# Patient Record
Sex: Male | Born: 1960
Health system: Southern US, Community
[De-identification: ages and names within clinical notes are randomized; demographics above are authoritative.]

## PROBLEM LIST (undated history)

## (undated) DIAGNOSIS — I10 Essential (primary) hypertension: Secondary | ICD-10-CM

## (undated) DIAGNOSIS — E119 Type 2 diabetes mellitus without complications: Secondary | ICD-10-CM

## (undated) DIAGNOSIS — E1169 Type 2 diabetes mellitus with other specified complication: Secondary | ICD-10-CM

## (undated) DIAGNOSIS — E079 Disorder of thyroid, unspecified: Secondary | ICD-10-CM

## (undated) DIAGNOSIS — Z87898 Personal history of other specified conditions: Secondary | ICD-10-CM

## (undated) HISTORY — DX: Essential (primary) hypertension: I10

## (undated) HISTORY — DX: Hyperlipidemia, unspecified: E11.69

## (undated) HISTORY — PX: COLONOSCOPY: SHX174

## (undated) HISTORY — PX: UPPER GI ENDOSCOPY: SHX6162

## (undated) HISTORY — DX: Type 2 diabetes mellitus without complications: E11.9

---

## 1999-09-03 ENCOUNTER — Encounter: Payer: Self-pay | Admitting: Family Medicine

## 1999-09-03 ENCOUNTER — Ambulatory Visit (HOSPITAL_COMMUNITY): Admission: RE | Admit: 1999-09-03 | Discharge: 1999-09-03 | Payer: Self-pay | Admitting: Family Medicine

## 1999-09-17 ENCOUNTER — Ambulatory Visit (HOSPITAL_COMMUNITY): Admission: RE | Admit: 1999-09-17 | Discharge: 1999-09-17 | Payer: Self-pay | Admitting: Family Medicine

## 1999-09-17 ENCOUNTER — Encounter: Payer: Self-pay | Admitting: Family Medicine

## 2000-06-03 ENCOUNTER — Other Ambulatory Visit: Admission: RE | Admit: 2000-06-03 | Discharge: 2000-06-03 | Payer: Self-pay | Admitting: Family Medicine

## 2000-10-03 ENCOUNTER — Ambulatory Visit (HOSPITAL_COMMUNITY): Admission: RE | Admit: 2000-10-03 | Discharge: 2000-10-03 | Payer: Self-pay | Admitting: Internal Medicine

## 2000-10-03 ENCOUNTER — Encounter: Payer: Self-pay | Admitting: Internal Medicine

## 2002-07-10 ENCOUNTER — Encounter: Payer: Self-pay | Admitting: Surgery

## 2002-07-19 ENCOUNTER — Observation Stay (HOSPITAL_COMMUNITY): Admission: RE | Admit: 2002-07-19 | Discharge: 2002-07-20 | Payer: Self-pay | Admitting: Surgery

## 2003-08-28 ENCOUNTER — Ambulatory Visit (HOSPITAL_COMMUNITY): Admission: RE | Admit: 2003-08-28 | Discharge: 2003-08-28 | Payer: Self-pay | Admitting: Family Medicine

## 2003-09-14 HISTORY — PX: THYROID SURGERY: SHX805

## 2012-10-27 ENCOUNTER — Ambulatory Visit (INDEPENDENT_AMBULATORY_CARE_PROVIDER_SITE_OTHER): Payer: Medicare HMO | Admitting: Emergency Medicine

## 2012-10-27 VITALS — BP 130/88 | HR 108 | Temp 98.0°F | Resp 16 | Ht 68.5 in | Wt 179.4 lb

## 2012-10-27 DIAGNOSIS — H8110 Benign paroxysmal vertigo, unspecified ear: Secondary | ICD-10-CM

## 2012-10-27 MED ORDER — MECLIZINE HCL 25 MG PO TABS: 25.0000 mg | ORAL_TABLET | Freq: Three times a day (TID) | ORAL | Status: DC | PRN

## 2012-10-27 NOTE — Progress Notes (Signed)
[facility] [address], [place] [address] [phone]  Date:  10/27/2012   Name:  Adrian Hampton   DOB:  Jun 26, 1961   MRN:  [id number]  PCP:  Provider Not In System    Chief Complaint: Dizziness   History of Present Illness:  Adrian Hampton is a 52 y.o. very pleasant male patient who presents with the following:  Ill for past two days with intermittent dizziness. Says started when he got out of bed Wednesday.  Occurs when he turns his head or looks up or down.  No antecedent illness or injury.  No neuro or visual symptoms.  No previous dizziness.  No nausea or vomiting.  There is no problem list on file for this patient.   Past Medical History  Diagnosis Date  . Hypertension     Past Surgical History  Procedure Laterality Date  . Thyroid surgery  2005    thyroidectomy    History  Substance Use Topics  . Smoking status: Never Smoker   . Smokeless tobacco: Not on file  . Alcohol Use: Not on file    No family history on file.  No Known Allergies  Medication list has been reviewed and updated.  No current outpatient prescriptions on file prior to visit.   No current facility-administered medications on file prior to visit.    Review of Systems:  As per HPI, otherwise negative.    Physical Examination: Filed Vitals:   10/27/12 1309  BP: 130/88  Pulse: 108  Temp: 98 F (36.7 C)  Resp: 16   Filed Vitals:   10/27/12 1309  Height: 5' 8.5" (1.74 m)  Weight: 179 lb 6.4 oz (81.375 kg)   Body mass index is 26.88 kg/(m^2). Ideal Body Weight: Weight in (lb) to have BMI = 25: 166.5  GEN: WDWN, NAD, Non-toxic, A & O x 3 HEENT: Atraumatic, Normocephalic. Neck supple. No masses, No LAD. Ears and Nose: No external deformity. CV: RRR, No M/G/R. No JVD. No thrill. No extra heart sounds. PULM: CTA B, no wheezes, crackles, rhonchi. No retractions. No resp. distress. No accessory muscle use. ABD: S, NT, ND, +BS. No rebound. No HSM. EXTR:  No c/c/e NEURO Normal gait.  PSYCH: Normally interactive. Conversant. Not depressed or anxious appearing.  Calm demeanor.    Assessment and Plan: Benign positional vertigo antivert Follow up as needed  Carmelina Dane, MD

## 2013-04-25 ENCOUNTER — Encounter: Payer: Self-pay | Admitting: Emergency Medicine

## 2013-07-14 ENCOUNTER — Emergency Department (HOSPITAL_COMMUNITY)
Admission: EM | Admit: 2013-07-14 | Discharge: 2013-07-14 | Disposition: A | Discharge status: Home / Self Care | Payer: Managed Care, Other (non HMO) | Attending: Emergency Medicine | Admitting: Emergency Medicine

## 2013-07-14 ENCOUNTER — Encounter (HOSPITAL_COMMUNITY): Payer: Self-pay | Admitting: Emergency Medicine

## 2013-07-14 DIAGNOSIS — R110 Nausea: Secondary | ICD-10-CM

## 2013-07-14 DIAGNOSIS — I10 Essential (primary) hypertension: Secondary | ICD-10-CM | POA: Insufficient documentation

## 2013-07-14 DIAGNOSIS — E079 Disorder of thyroid, unspecified: Secondary | ICD-10-CM | POA: Insufficient documentation

## 2013-07-14 DIAGNOSIS — R197 Diarrhea, unspecified: Secondary | ICD-10-CM | POA: Insufficient documentation

## 2013-07-14 DIAGNOSIS — E669 Obesity, unspecified: Secondary | ICD-10-CM | POA: Insufficient documentation

## 2013-07-14 DIAGNOSIS — R634 Abnormal weight loss: Secondary | ICD-10-CM

## 2013-07-14 DIAGNOSIS — R5381 Other malaise: Secondary | ICD-10-CM | POA: Insufficient documentation

## 2013-07-14 DIAGNOSIS — Z79899 Other long term (current) drug therapy: Secondary | ICD-10-CM | POA: Insufficient documentation

## 2013-07-14 DIAGNOSIS — R112 Nausea with vomiting, unspecified: Secondary | ICD-10-CM | POA: Insufficient documentation

## 2013-07-14 HISTORY — DX: Disorder of thyroid, unspecified: E07.9

## 2013-07-14 LAB — CBC
HCT: 43.5 % (ref 39.0–52.0)
Hemoglobin: 15.6 g/dL (ref 13.0–17.0)
MCH: 31.2 pg (ref 26.0–34.0)
MCHC: 35.9 g/dL (ref 30.0–36.0)
MCV: 87 fL (ref 78.0–100.0)
Platelets: 330 10*3/uL (ref 150–400)
RBC: 5 MIL/uL (ref 4.22–5.81)
RDW: 12.2 % (ref 11.5–15.5)
WBC: 5.9 10*3/uL (ref 4.0–10.5)

## 2013-07-14 LAB — COMPREHENSIVE METABOLIC PANEL
ALT: 28 U/L (ref 0–53)
AST: 22 U/L (ref 0–37)
Albumin: 4.1 g/dL (ref 3.5–5.2)
Alkaline Phosphatase: 70 U/L (ref 39–117)
BUN: 12 mg/dL (ref 6–23)
CO2: 27 mEq/L (ref 19–32)
Calcium: 9.8 mg/dL (ref 8.4–10.5)
Chloride: 101 mEq/L (ref 96–112)
Creatinine, Ser: 1.23 mg/dL (ref 0.50–1.35)
GFR calc Af Amer: 78 mL/min — ABNORMAL LOW (ref >90)
GFR calc non Af Amer: 67 mL/min — ABNORMAL LOW (ref >90)
Glucose, Bld: 101 mg/dL — ABNORMAL HIGH (ref 70–99)
Potassium: 4.1 mEq/L (ref 3.5–5.1)
Sodium: 136 mEq/L (ref 135–145)
Total Bilirubin: 0.6 mg/dL (ref 0.3–1.2)
Total Protein: 7.9 g/dL (ref 6.0–8.3)

## 2013-07-14 MED ORDER — ONDANSETRON HCL 4 MG PO TABS: 4.0000 mg | ORAL_TABLET | Freq: Four times a day (QID) | ORAL | Status: DC

## 2013-07-14 MED ORDER — ONDANSETRON HCL 4 MG/2ML IJ SOLN
4.0000 mg | Freq: Once | INTRAMUSCULAR | Status: AC
  Administered 2013-07-14: 4 mg via INTRAVENOUS
  Filled 2013-07-14: qty 2

## 2013-07-14 MED ORDER — SODIUM CHLORIDE 0.9 % IV BOLUS (SEPSIS)
1000.0000 mL | INTRAVENOUS | Status: AC
  Administered 2013-07-14: 1000 mL via INTRAVENOUS

## 2013-07-14 NOTE — ED Notes (Signed)
Pt had been seen by a GI MD earlier this week and placed on Nexium w/o relief.  Was treated around March of this year of this year for vertigo but states this differs b/c he only gets nauseated but doesn't get dizzy like he did w/vertigo.

## 2013-07-14 NOTE — ED Notes (Signed)
Erin, PA at bedside. 

## 2013-07-14 NOTE — ED Notes (Signed)
Pt from home c/o nausea and emesis that started on October 18th, ending on the 24th. Pt reports that he still has nausea, loss of appetite, weight loss. Pt states that he can eat and keep food down, but he has no appetite. Pt c/o dizziness when ambulating only. Pt denies blood in stool or urine. Pt was seen at PCP. Pt adds that he has lost 14lbs in 2 weeks. Pt is A&O and in NAD

## 2013-07-14 NOTE — ED Provider Notes (Signed)
CSN: [id number]     Arrival date & time 07/14/13  1236 History   First MD Initiated Contact with Patient 07/14/13 1318     Chief Complaint  Patient presents with  . Emesis  . Weight Loss   (Consider location/radiation/quality/duration/timing/severity/associated sxs/prior Treatment) HPI Pt is a 52yo male with hx of thyroid disease and HTN c/o 2 week hx of nausea and vomiting that started on Oct 18th ended Oct 24th but still reports nausea.  States he has lost 14lbs in 2 weeks.  While he is able to eat and keep food down, he reports having no appetite.  He was evaluated twice by his PCP for diarrhea, was given Ciprofloxacin at the beginning of the month.  That resolved, then the nausea and vomiting started.  Although vomiting has stopped, he still has nausea.  His PCP performed a BMP which was normal.  Pt was also seen by a GI specialist who prescribed Nexium, advised if the medication did not work, a scope would be performed next week. Denies hx of fever, chest pain, SOB, abdominal pain or dysuria. Denies blood in stool or urine. Denies recent travel or sick contacts.   Past Medical History  Diagnosis Date  . Hypertension   . Thyroid disease    Past Surgical History  Procedure Laterality Date  . Thyroid surgery  2005    thyroidectomy   No family history on file. History  Substance Use Topics  . Smoking status: Never Smoker   . Smokeless tobacco: Not on file  . Alcohol Use: Yes     Comment: socailly    Review of Systems  Constitutional: Positive for appetite change, fatigue and unexpected weight change ( 14lbs in 2 weeks). Negative for fever and chills.  Respiratory: Negative for shortness of breath.   Cardiovascular: Negative for chest pain.  Gastrointestinal: Positive for nausea and diarrhea. Negative for vomiting and abdominal pain.  Genitourinary: Negative for dysuria.  Neurological: Positive for weakness.  All other systems reviewed and are negative.    Allergies  Review  of patient's allergies indicates no known allergies.  Home Medications   Current Outpatient Rx  Name  Route  Sig  Dispense  Refill  . cetirizine (ZYRTEC) 10 MG tablet   Oral   Take 10 mg by mouth daily.         Marland Kitchen levothyroxine (SYNTHROID, LEVOTHROID) 112 MCG tablet   Oral   Take 112 mcg by mouth daily.         Marland Kitchen lisinopril (PRINIVIL,ZESTRIL) 40 MG tablet   Oral   Take 40 mg by mouth daily.         . promethazine (PHENERGAN) 25 MG tablet   Oral   Take 25 mg by mouth every 6 (six) hours as needed for nausea.         . ondansetron (ZOFRAN) 4 MG tablet   Oral   Take 1 tablet (4 mg total) by mouth every 6 (six) hours.   12 tablet   0    BP 134/85  Pulse 85  Temp(Src) 98.1 F (36.7 C) (Oral)  Resp 18  SpO2 98% Physical Exam  Nursing note and vitals reviewed. Constitutional: He appears well-developed and well-nourished.  Obese male lying comfortably in exam bed. NAD.  HENT:  Head: Normocephalic and atraumatic.  Right Ear: Hearing, tympanic membrane, external ear and ear canal normal.  Left Ear: Hearing, tympanic membrane, external ear and ear canal normal.  Nose: Nose normal.  Mouth/Throat: Uvula is midline,  oropharynx is clear and moist and mucous membranes are normal. No oropharyngeal exudate, posterior oropharyngeal edema, posterior oropharyngeal erythema or tonsillar abscesses.  Eyes: Conjunctivae are normal. No scleral icterus.  Neck: Normal range of motion.  Cardiovascular: Normal rate, regular rhythm and normal heart sounds.   Pulmonary/Chest: Effort normal and breath sounds normal. No respiratory distress. He has no wheezes. He has no rales. He exhibits no tenderness.  Abdominal: Soft. Bowel sounds are normal. He exhibits no distension and no mass. There is no tenderness. There is no rebound and no guarding.  Soft, non-distended, non-tender.  Musculoskeletal: Normal range of motion.  Neurological: He is alert.  Skin: Skin is warm and dry.    ED Course   Procedures (including critical care time) Labs Review Labs Reviewed  COMPREHENSIVE METABOLIC PANEL - Abnormal; Notable for the following:    Glucose, Bld 101 (*)    GFR calc non Af Amer 67 (*)    GFR calc Af Amer 78 (*)    All other components within normal limits  CBC   Imaging Review No results found.  EKG Interpretation   None       MDM   1. Nausea   2. Weight loss    Pt presenting with nausea and weight loss, although does not appear chronically ill.  Vitals: unremarkable.  Although pt had normal BMP performed at PCP just last week, will get a CMP to check liver enzymes and CBC.    Labs: GFR-potential CKD (discussed with pt) but no acute findings.  Labs otherwise, unremarkable.  All labs/imaging/findings discussed with patient. All questions answered and concerns addressed. Will discharge pt home and have pt f/u with GI and his PCP for further evaluation of nausea and weight loss.  Rx: zofran as pt currently has phenergan at home but states that has not helped.  Return precautions given. Pt verbalized understanding and agreement with tx plan. Vitals: unremarkable. Discharged in stable condition.    Discussed pt with attending during ED encounter and agrees with plan.     Junius Finner, PA-C 07/14/13 1556

## 2013-07-15 NOTE — ED Provider Notes (Signed)
Medical screening examination/treatment/procedure(s) were conducted as a shared visit with non-physician practitioner(s) and myself.  I personally evaluated the patient during the encounter.  EKG Interpretation   None       Pt notes recent wt loss. No abd pain. No cp or sob. No cough. No fever or chills. abd soft nt. Labs.    Suzi Roots, MD 07/15/13 [phone]

## 2013-07-17 ENCOUNTER — Other Ambulatory Visit: Payer: Self-pay | Admitting: Gastroenterology

## 2013-07-17 DIAGNOSIS — R634 Abnormal weight loss: Secondary | ICD-10-CM

## 2013-07-17 DIAGNOSIS — R198 Other specified symptoms and signs involving the digestive system and abdomen: Secondary | ICD-10-CM

## 2013-07-17 DIAGNOSIS — R110 Nausea: Secondary | ICD-10-CM

## 2013-07-19 ENCOUNTER — Ambulatory Visit
Admission: RE | Admit: 2013-07-19 | Discharge: 2013-07-19 | Disposition: A | Discharge status: Home / Self Care | Payer: Managed Care, Other (non HMO) | Source: Ambulatory Visit | Attending: Gastroenterology | Admitting: Gastroenterology

## 2013-07-19 DIAGNOSIS — R634 Abnormal weight loss: Secondary | ICD-10-CM

## 2013-07-19 DIAGNOSIS — R110 Nausea: Secondary | ICD-10-CM

## 2013-07-19 DIAGNOSIS — R198 Other specified symptoms and signs involving the digestive system and abdomen: Secondary | ICD-10-CM

## 2013-08-17 ENCOUNTER — Other Ambulatory Visit: Payer: Self-pay | Admitting: Gastroenterology

## 2013-08-17 DIAGNOSIS — R112 Nausea with vomiting, unspecified: Secondary | ICD-10-CM

## 2013-08-17 DIAGNOSIS — R634 Abnormal weight loss: Secondary | ICD-10-CM

## 2013-08-20 ENCOUNTER — Ambulatory Visit
Admission: RE | Admit: 2013-08-20 | Discharge: 2013-08-20 | Disposition: A | Discharge status: Home / Self Care | Payer: Managed Care, Other (non HMO) | Source: Ambulatory Visit | Attending: Gastroenterology | Admitting: Gastroenterology

## 2013-08-20 DIAGNOSIS — R112 Nausea with vomiting, unspecified: Secondary | ICD-10-CM

## 2013-08-20 DIAGNOSIS — R634 Abnormal weight loss: Secondary | ICD-10-CM

## 2013-08-20 MED ORDER — IOHEXOL 300 MG/ML  SOLN
100.0000 mL | Freq: Once | INTRAMUSCULAR | Status: AC | PRN
  Administered 2013-08-20: 100 mL via INTRAVENOUS

## 2013-10-10 ENCOUNTER — Other Ambulatory Visit: Payer: Self-pay | Admitting: Gastroenterology

## 2013-10-10 DIAGNOSIS — R112 Nausea with vomiting, unspecified: Secondary | ICD-10-CM

## 2013-10-12 ENCOUNTER — Ambulatory Visit (HOSPITAL_COMMUNITY)
Admission: RE | Admit: 2013-10-12 | Discharge: 2013-10-12 | Disposition: A | Discharge status: Home / Self Care | Payer: Managed Care, Other (non HMO) | Source: Ambulatory Visit | Attending: Gastroenterology | Admitting: Gastroenterology

## 2013-10-12 DIAGNOSIS — R948 Abnormal results of function studies of other organs and systems: Secondary | ICD-10-CM | POA: Insufficient documentation

## 2013-10-12 DIAGNOSIS — R112 Nausea with vomiting, unspecified: Secondary | ICD-10-CM | POA: Insufficient documentation

## 2013-10-12 MED ORDER — TECHNETIUM TC 99M MEBROFENIN IV KIT
5.0000 | PACK | Freq: Once | INTRAVENOUS | Status: AC | PRN
  Administered 2013-10-12: 5 via INTRAVENOUS

## 2013-10-12 MED ORDER — STERILE WATER FOR INJECTION IJ SOLN
INTRAMUSCULAR | Status: AC
  Filled 2013-10-12: qty 10

## 2013-10-12 MED ORDER — SINCALIDE 5 MCG IJ SOLR
0.0200 ug/kg | Freq: Once | INTRAMUSCULAR | Status: AC
  Administered 2013-10-12: 1.59 ug via INTRAVENOUS

## 2013-10-12 MED ORDER — SINCALIDE 5 MCG IJ SOLR
INTRAMUSCULAR | Status: AC
  Administered 2013-10-12: 12:00:00 1.59 ug via INTRAVENOUS
  Filled 2013-10-12: qty 5

## 2013-10-16 ENCOUNTER — Ambulatory Visit (INDEPENDENT_AMBULATORY_CARE_PROVIDER_SITE_OTHER): Payer: Managed Care, Other (non HMO) | Admitting: General Surgery

## 2013-10-16 ENCOUNTER — Encounter (INDEPENDENT_AMBULATORY_CARE_PROVIDER_SITE_OTHER): Payer: Self-pay | Admitting: General Surgery

## 2013-10-16 VITALS — BP 114/70 | HR 80 | Temp 98.1°F | Resp 14 | Ht 68.5 in | Wt 174.2 lb

## 2013-10-16 DIAGNOSIS — K828 Other specified diseases of gallbladder: Secondary | ICD-10-CM

## 2013-10-16 NOTE — Progress Notes (Signed)
Patient ID: Adrian Hampton, male   DOB: 02/26/1961, 52 y.o.   MRN: [id number]  Chief Complaint  Patient presents with  . New Evaluation    eval biliary dyskenisia    HPI Adrian Hampton is a 52 y.o. male.  Being evaluated for biliary dyskinesia.  HPI This patient has been having symptoms of severe nausea and vomiting dating back to October 2014. During December of 2014 his symptoms actually abated however they came back within the last 2 weeks, starting when he ate a hamburger at a local restaurant. Since then he awakens with nausea and if he tries he did start vomiting profusely. He has multiple antinausea medications. A HIDA scan was done which demonstrated a biliary contraction of only 3.8%  . He was refer to surgery for evaluation. Past Medical History  Diagnosis Date  . Hypertension   . Thyroid disease     Past Surgical History  Procedure Laterality Date  . Thyroid surgery  2005    thyroidectomy    Family History  Problem Relation Age of Onset  . Cancer Mother     pancreatic    Social History History  Substance Use Topics  . Smoking status: Never Smoker   . Smokeless tobacco: Never Used  . Alcohol Use: Yes     Comment: socailly    No Known Allergies  Current Outpatient Prescriptions  Medication Sig Dispense Refill  . levothyroxine (SYNTHROID, LEVOTHROID) 112 MCG tablet Take 112 mcg by mouth daily.      . lisinopril (PRINIVIL,ZESTRIL) 40 MG tablet Take 40 mg by mouth daily.      . ondansetron (ZOFRAN) 4 MG tablet Take 1 tablet (4 mg total) by mouth every 6 (six) hours.  12 tablet  0  . promethazine (PHENERGAN) 25 MG tablet Take 25 mg by mouth every 6 (six) hours as needed for nausea.       No current facility-administered medications for this visit.    Review of Systems Review of Systems  Gastrointestinal: Positive for nausea and vomiting.  All other systems reviewed and are negative.    Blood pressure 114/70, pulse 80, temperature 98.1 F (36.7 C),  temperature source Oral, resp. rate 14, height 5' 8.5" (1.74 m), weight 174 lb 3.2 oz (79.017 kg).  Physical Exam Physical Exam  Constitutional: He is oriented to person, place, and time. He appears well-developed and well-nourished.  HENT:  Head: Normocephalic and atraumatic.  Eyes: Conjunctivae and EOM are normal. Pupils are equal, round, and reactive to light.  Neck: Normal range of motion. Neck supple.  Cardiovascular: Normal rate, regular rhythm and normal heart sounds.   Pulmonary/Chest: Effort normal and breath sounds normal.  Abdominal: Soft. Bowel sounds are normal. He exhibits no distension. There is no tenderness.  Musculoskeletal: Normal range of motion.  Neurological: He is alert and oriented to person, place, and time. He has normal reflexes.  Skin: Skin is warm and dry.  Psychiatric: He has a normal mood and affect. His behavior is normal. Judgment and thought content normal.    Data Reviewed I have reviewed the patient's laboratory studies and his radiologic studies  Assessment    Biliary dyskinesia with significant symptoms of nausea vomiting. I believe that this is very likely causing the patient's problem.     Plan    Laparoscopic cholecystectomy with possible cholangiogram. I told the patient that based on his current symptoms and the HIDA scan That he should have significant improvement after surgery. The patient wants to   go ahead and schedule. The risk and benefits have been explained to the patient and he wishes to proceed.       Donelle Hise O 10/16/2013, 12:38 PM    

## 2013-10-26 ENCOUNTER — Encounter (HOSPITAL_BASED_OUTPATIENT_CLINIC_OR_DEPARTMENT_OTHER): Payer: Self-pay | Admitting: *Deleted

## 2013-10-26 NOTE — Progress Notes (Signed)
To come in for ekg and CCS labs-has been 8 yr since last ekg-never any problems

## 2013-10-29 ENCOUNTER — Other Ambulatory Visit: Payer: Self-pay

## 2013-10-29 ENCOUNTER — Encounter (HOSPITAL_BASED_OUTPATIENT_CLINIC_OR_DEPARTMENT_OTHER)
Admission: RE | Admit: 2013-10-29 | Discharge: 2013-10-29 | Disposition: A | Discharge status: Home / Self Care | Payer: Managed Care, Other (non HMO) | Source: Ambulatory Visit | Attending: General Surgery | Admitting: General Surgery

## 2013-10-29 LAB — COMPREHENSIVE METABOLIC PANEL
ALBUMIN: 4 g/dL (ref 3.5–5.2)
ALK PHOS: 76 U/L (ref 39–117)
ALT: 37 U/L (ref 0–53)
AST: 26 U/L (ref 0–37)
BUN: 11 mg/dL (ref 6–23)
CHLORIDE: 104 meq/L (ref 96–112)
CO2: 25 mEq/L (ref 19–32)
Calcium: 9.6 mg/dL (ref 8.4–10.5)
Creatinine, Ser: 1.2 mg/dL (ref 0.50–1.35)
GFR calc Af Amer: 80 mL/min — ABNORMAL LOW (ref >90)
GFR calc non Af Amer: 69 mL/min — ABNORMAL LOW (ref >90)
Glucose, Bld: 96 mg/dL (ref 70–99)
POTASSIUM: 4.8 meq/L (ref 3.7–5.3)
SODIUM: 141 meq/L (ref 137–147)
TOTAL PROTEIN: 7.8 g/dL (ref 6.0–8.3)
Total Bilirubin: 0.6 mg/dL (ref 0.3–1.2)

## 2013-10-29 LAB — CBC WITH DIFFERENTIAL/PLATELET
BASOS ABS: 0 10*3/uL (ref 0.0–0.1)
BASOS PCT: 1 % (ref 0–1)
EOS ABS: 0.1 10*3/uL (ref 0.0–0.7)
Eosinophils Relative: 2 % (ref 0–5)
HCT: 42.7 % (ref 39.0–52.0)
Hemoglobin: 15.2 g/dL (ref 13.0–17.0)
Lymphocytes Relative: 34 % (ref 12–46)
Lymphs Abs: 1.7 10*3/uL (ref 0.7–4.0)
MCH: 31.1 pg (ref 26.0–34.0)
MCHC: 35.6 g/dL (ref 30.0–36.0)
MCV: 87.5 fL (ref 78.0–100.0)
MONOS PCT: 8 % (ref 3–12)
Monocytes Absolute: 0.4 10*3/uL (ref 0.1–1.0)
NEUTROS ABS: 2.8 10*3/uL (ref 1.7–7.7)
Neutrophils Relative %: 56 % (ref 43–77)
PLATELETS: 354 10*3/uL (ref 150–400)
RBC: 4.88 MIL/uL (ref 4.22–5.81)
RDW: 12.4 % (ref 11.5–15.5)
WBC: 4.9 10*3/uL (ref 4.0–10.5)

## 2013-10-30 ENCOUNTER — Ambulatory Visit (HOSPITAL_COMMUNITY): Payer: Managed Care, Other (non HMO)

## 2013-11-01 ENCOUNTER — Encounter (HOSPITAL_BASED_OUTPATIENT_CLINIC_OR_DEPARTMENT_OTHER): Payer: Managed Care, Other (non HMO) | Admitting: Anesthesiology

## 2013-11-01 ENCOUNTER — Encounter (HOSPITAL_BASED_OUTPATIENT_CLINIC_OR_DEPARTMENT_OTHER): Payer: Self-pay

## 2013-11-01 ENCOUNTER — Encounter (HOSPITAL_BASED_OUTPATIENT_CLINIC_OR_DEPARTMENT_OTHER)
Admission: RE | Disposition: A | Discharge status: Home / Self Care | Payer: Self-pay | Source: Ambulatory Visit | Attending: General Surgery

## 2013-11-01 ENCOUNTER — Ambulatory Visit (HOSPITAL_BASED_OUTPATIENT_CLINIC_OR_DEPARTMENT_OTHER)
Admission: RE | Admit: 2013-11-01 | Discharge: 2013-11-01 | Disposition: A | Discharge status: Home / Self Care | Payer: Managed Care, Other (non HMO) | Source: Ambulatory Visit | Attending: General Surgery | Admitting: General Surgery

## 2013-11-01 ENCOUNTER — Ambulatory Visit (HOSPITAL_BASED_OUTPATIENT_CLINIC_OR_DEPARTMENT_OTHER): Payer: Managed Care, Other (non HMO) | Admitting: Anesthesiology

## 2013-11-01 DIAGNOSIS — Z01810 Encounter for preprocedural cardiovascular examination: Secondary | ICD-10-CM | POA: Insufficient documentation

## 2013-11-01 DIAGNOSIS — I10 Essential (primary) hypertension: Secondary | ICD-10-CM | POA: Insufficient documentation

## 2013-11-01 DIAGNOSIS — Z01812 Encounter for preprocedural laboratory examination: Secondary | ICD-10-CM | POA: Insufficient documentation

## 2013-11-01 DIAGNOSIS — K811 Chronic cholecystitis: Secondary | ICD-10-CM | POA: Insufficient documentation

## 2013-11-01 DIAGNOSIS — K828 Other specified diseases of gallbladder: Secondary | ICD-10-CM | POA: Insufficient documentation

## 2013-11-01 DIAGNOSIS — E079 Disorder of thyroid, unspecified: Secondary | ICD-10-CM | POA: Insufficient documentation

## 2013-11-01 HISTORY — DX: Personal history of other specified conditions: Z87.898

## 2013-11-01 HISTORY — PX: CHOLECYSTECTOMY: SHX55

## 2013-11-01 LAB — POCT HEMOGLOBIN-HEMACUE: Hemoglobin: 15.4 g/dL (ref 13.0–17.0)

## 2013-11-01 SURGERY — LAPAROSCOPIC CHOLECYSTECTOMY WITH INTRAOPERATIVE CHOLANGIOGRAM
Anesthesia: General | Site: Abdomen

## 2013-11-01 MED ORDER — CHLORHEXIDINE GLUCONATE 4 % EX LIQD: 1.0000 "application " | Freq: Once | CUTANEOUS | Status: DC

## 2013-11-01 MED ORDER — PROPOFOL 10 MG/ML IV BOLUS
INTRAVENOUS | Status: DC | PRN
  Administered 2013-11-01: 200 mg via INTRAVENOUS

## 2013-11-01 MED ORDER — OXYCODONE HCL 5 MG PO TABS: 5.0000 mg | ORAL_TABLET | Freq: Once | ORAL | Status: DC | PRN

## 2013-11-01 MED ORDER — DEXTROSE 5 % IV SOLN
INTRAVENOUS | Status: AC
  Filled 2013-11-01 (×2): qty 1

## 2013-11-01 MED ORDER — BUPIVACAINE-EPINEPHRINE 0.25% -1:200000 IJ SOLN
INTRAMUSCULAR | Status: DC | PRN
  Administered 2013-11-01: 20 mL

## 2013-11-01 MED ORDER — OXYCODONE HCL 5 MG/5ML PO SOLN: 5.0000 mg | Freq: Once | ORAL | Status: DC | PRN

## 2013-11-01 MED ORDER — BUPIVACAINE-EPINEPHRINE PF 0.25-1:200000 % IJ SOLN
INTRAMUSCULAR | Status: AC
  Filled 2013-11-01: qty 30

## 2013-11-01 MED ORDER — ROCURONIUM BROMIDE 100 MG/10ML IV SOLN
INTRAVENOUS | Status: DC | PRN
  Administered 2013-11-01: 50 mg via INTRAVENOUS

## 2013-11-01 MED ORDER — LACTATED RINGERS IV SOLN
INTRAVENOUS | Status: DC
  Administered 2013-11-01 (×2): via INTRAVENOUS

## 2013-11-01 MED ORDER — FENTANYL CITRATE 0.05 MG/ML IJ SOLN
INTRAMUSCULAR | Status: DC | PRN
  Administered 2013-11-01: 100 ug via INTRAVENOUS
  Administered 2013-11-01 (×2): 50 ug via INTRAVENOUS

## 2013-11-01 MED ORDER — ONDANSETRON HCL 4 MG/2ML IJ SOLN
INTRAMUSCULAR | Status: DC | PRN
Start: 2013-11-01 — End: 2013-11-01
  Administered 2013-11-01: 4 mg via INTRAVENOUS

## 2013-11-01 MED ORDER — OXYCODONE-ACETAMINOPHEN 5-325 MG PO TABS: 1.0000 | ORAL_TABLET | ORAL | Status: DC | PRN

## 2013-11-01 MED ORDER — BUPIVACAINE-EPINEPHRINE PF 0.5-1:200000 % IJ SOLN
INTRAMUSCULAR | Status: AC
  Filled 2013-11-01: qty 30

## 2013-11-01 MED ORDER — FENTANYL CITRATE 0.05 MG/ML IJ SOLN: 50.0000 ug | INTRAMUSCULAR | Status: DC | PRN

## 2013-11-01 MED ORDER — LIDOCAINE HCL (CARDIAC) 20 MG/ML IV SOLN
INTRAVENOUS | Status: DC | PRN
  Administered 2013-11-01: 80 mg via INTRAVENOUS

## 2013-11-01 MED ORDER — MIDAZOLAM HCL 2 MG/2ML IJ SOLN: 1.0000 mg | INTRAMUSCULAR | Status: DC | PRN

## 2013-11-01 MED ORDER — MIDAZOLAM HCL 5 MG/5ML IJ SOLN
INTRAMUSCULAR | Status: DC | PRN
  Administered 2013-11-01: 2 mg via INTRAVENOUS

## 2013-11-01 MED ORDER — MIDAZOLAM HCL 2 MG/2ML IJ SOLN
INTRAMUSCULAR | Status: AC
  Filled 2013-11-01: qty 2

## 2013-11-01 MED ORDER — DEXTROSE 5 % IV SOLN
2.0000 g | INTRAVENOUS | Status: AC
  Administered 2013-11-01: 2 g via INTRAVENOUS

## 2013-11-01 MED ORDER — SODIUM CHLORIDE 0.9 % IR SOLN
Status: DC | PRN
  Administered 2013-11-01 (×2): 1000 mL

## 2013-11-01 MED ORDER — GLYCOPYRROLATE 0.2 MG/ML IJ SOLN
INTRAMUSCULAR | Status: DC | PRN
  Administered 2013-11-01: .4 mg via INTRAVENOUS
  Administered 2013-11-01: 0.2 mg via INTRAVENOUS

## 2013-11-01 MED ORDER — HYDROMORPHONE HCL PF 1 MG/ML IJ SOLN
0.2500 mg | INTRAMUSCULAR | Status: DC | PRN
  Administered 2013-11-01 (×2): 0.5 mg via INTRAVENOUS

## 2013-11-01 MED ORDER — PROMETHAZINE HCL 25 MG/ML IJ SOLN
INTRAMUSCULAR | Status: AC
  Filled 2013-11-01: qty 1

## 2013-11-01 MED ORDER — DEXAMETHASONE SODIUM PHOSPHATE 4 MG/ML IJ SOLN
INTRAMUSCULAR | Status: DC | PRN
  Administered 2013-11-01: 10 mg via INTRAVENOUS

## 2013-11-01 MED ORDER — PROMETHAZINE HCL 25 MG/ML IJ SOLN
12.5000 mg | Freq: Four times a day (QID) | INTRAMUSCULAR | Status: DC | PRN
  Administered 2013-11-01: 12.5 mg via INTRAVENOUS

## 2013-11-01 MED ORDER — ONDANSETRON HCL 4 MG/2ML IJ SOLN
INTRAMUSCULAR | Status: AC
Start: 2013-11-01 — End: 2013-11-01
  Filled 2013-11-01: qty 2

## 2013-11-01 MED ORDER — PROPOFOL 10 MG/ML IV BOLUS
INTRAVENOUS | Status: AC
  Filled 2013-11-01: qty 20

## 2013-11-01 MED ORDER — HYDROMORPHONE HCL PF 1 MG/ML IJ SOLN
INTRAMUSCULAR | Status: AC
  Filled 2013-11-01: qty 1

## 2013-11-01 MED ORDER — ONDANSETRON HCL 4 MG/2ML IJ SOLN
4.0000 mg | Freq: Four times a day (QID) | INTRAMUSCULAR | Status: AC | PRN
  Administered 2013-11-01: 4 mg via INTRAVENOUS

## 2013-11-01 MED ORDER — FENTANYL CITRATE 0.05 MG/ML IJ SOLN
INTRAMUSCULAR | Status: AC
  Filled 2013-11-01: qty 6

## 2013-11-01 MED ORDER — NEOSTIGMINE METHYLSULFATE 1 MG/ML IJ SOLN
INTRAMUSCULAR | Status: DC | PRN
  Administered 2013-11-01: 3 mg via INTRAVENOUS
  Administered 2013-11-01: 1 mg via INTRAVENOUS

## 2013-11-01 SURGICAL SUPPLY — 53 items
ADH SKN CLS APL DERMABOND .7 (GAUZE/BANDAGES/DRESSINGS) ×1
APPLIER CLIP 5 13 M/L LIGAMAX5 (MISCELLANEOUS) ×3
APPLIER CLIP ROT 10 11.4 M/L (STAPLE)
APR CLP MED LRG 11.4X10 (STAPLE)
APR CLP MED LRG 5 ANG JAW (MISCELLANEOUS) ×1
BAG SPEC RTRVL LRG 6X4 10 (ENDOMECHANICALS) ×1
BLADE SURG ROTATE 9660 (MISCELLANEOUS) IMPLANT
CANISTER SUCT 3000ML (MISCELLANEOUS) ×3 IMPLANT
CHLORAPREP W/TINT 26ML (MISCELLANEOUS) ×3 IMPLANT
CLEANER CAUTERY TIP 5X5 PAD (MISCELLANEOUS) ×1 IMPLANT
CLIP APPLIE 5 13 M/L LIGAMAX5 (MISCELLANEOUS) ×1 IMPLANT
CLIP APPLIE ROT 10 11.4 M/L (STAPLE) IMPLANT
COVER MAYO STAND STRL (DRAPES) ×3 IMPLANT
DECANTER SPIKE VIAL GLASS SM (MISCELLANEOUS) IMPLANT
DERMABOND ADVANCED (GAUZE/BANDAGES/DRESSINGS) ×2
DERMABOND ADVANCED .7 DNX12 (GAUZE/BANDAGES/DRESSINGS) ×1 IMPLANT
DRAPE C-ARM 42X72 X-RAY (DRAPES) ×1 IMPLANT
DRAPE UTILITY XL STRL (DRAPES) ×3 IMPLANT
DRSG TEGADERM 2-3/8X2-3/4 SM (GAUZE/BANDAGES/DRESSINGS) ×12 IMPLANT
ELECT REM PT RETURN 9FT ADLT (ELECTROSURGICAL) ×3
ELECTRODE REM PT RTRN 9FT ADLT (ELECTROSURGICAL) ×1 IMPLANT
FILTER SMOKE EVAC LAPAROSHD (FILTER) ×3 IMPLANT
GLOVE BIOGEL M 7.0 STRL (GLOVE) ×2 IMPLANT
GLOVE BIOGEL PI IND STRL 7.0 (GLOVE) IMPLANT
GLOVE BIOGEL PI IND STRL 7.5 (GLOVE) IMPLANT
GLOVE BIOGEL PI IND STRL 8 (GLOVE) ×1 IMPLANT
GLOVE BIOGEL PI INDICATOR 7.0 (GLOVE) ×2
GLOVE BIOGEL PI INDICATOR 7.5 (GLOVE) ×2
GLOVE BIOGEL PI INDICATOR 8 (GLOVE) ×2
GLOVE ECLIPSE 6.5 STRL STRAW (GLOVE) ×2 IMPLANT
GLOVE ECLIPSE 7.5 STRL STRAW (GLOVE) ×3 IMPLANT
GOWN STRL REUS W/ TWL LRG LVL3 (GOWN DISPOSABLE) ×3 IMPLANT
GOWN STRL REUS W/TWL LRG LVL3 (GOWN DISPOSABLE) ×9
HEMOSTAT SNOW SURGICEL 2X4 (HEMOSTASIS) ×1 IMPLANT
NS IRRIG 1000ML POUR BTL (IV SOLUTION) IMPLANT
PACK BASIN DAY SURGERY FS (CUSTOM PROCEDURE TRAY) ×3 IMPLANT
PAD CLEANER CAUTERY TIP 5X5 (MISCELLANEOUS) ×2
POUCH SPECIMEN RETRIEVAL 10MM (ENDOMECHANICALS) ×3 IMPLANT
SCISSORS LAP 5X35 DISP (ENDOMECHANICALS) IMPLANT
SET CHOLANGIOGRAPH 5 50 .035 (SET/KITS/TRAYS/PACK) IMPLANT
SET IRRIG TUBING LAPAROSCOPIC (IRRIGATION / IRRIGATOR) ×3 IMPLANT
SLEEVE ENDOPATH XCEL 5M (ENDOMECHANICALS) ×6 IMPLANT
SLEEVE SCD COMPRESS KNEE MED (MISCELLANEOUS) ×3 IMPLANT
SPECIMEN JAR SMALL (MISCELLANEOUS) ×3 IMPLANT
SUT MNCRL AB 4-0 PS2 18 (SUTURE) ×3 IMPLANT
TOWEL OR 17X24 6PK STRL BLUE (TOWEL DISPOSABLE) ×3 IMPLANT
TOWEL OR NON WOVEN STRL DISP B (DISPOSABLE) ×3 IMPLANT
TRAY LAPAROSCOPIC (CUSTOM PROCEDURE TRAY) ×3 IMPLANT
TROCAR XCEL BLUNT TIP 100MML (ENDOMECHANICALS) ×3 IMPLANT
TROCAR XCEL NON-BLD 11X100MML (ENDOMECHANICALS) IMPLANT
TROCAR XCEL NON-BLD 5MMX100MML (ENDOMECHANICALS) ×3 IMPLANT
TUBE CONNECTING 20'X1/4 (TUBING) ×1
TUBE CONNECTING 20X1/4 (TUBING) ×2 IMPLANT

## 2013-11-01 NOTE — Transfer of Care (Signed)
Immediate Anesthesia Transfer of Care Note  Patient: Adrian Hampton  Procedure(s) Performed: Procedure(s): LAPAROSCOPIC CHOLECYSTECTOMY (N/A)  Patient Location: PACU  Anesthesia Type:General  Level of Consciousness: awake, alert  and oriented  Airway & Oxygen Therapy: Patient Spontanous Breathing and Patient connected to face mask oxygen  Post-op Assessment: Report given to PACU RN and Post -op Vital signs reviewed and stable  Post vital signs: Reviewed and stable  Complications: No apparent anesthesia complications

## 2013-11-01 NOTE — Op Note (Signed)
OPERATIVE REPORT  DATE OF OPERATION: 11/01/2013  PATIENT:  Adrian Hampton  53 y.o. male  PRE-OPERATIVE DIAGNOSIS:  biliary dyskinesia   POST-OPERATIVE DIAGNOSIS:  biliary dyskinesia   PROCEDURE:  Procedure(s): LAPAROSCOPIC CHOLECYSTECTOMY  SURGEON:  Surgeon(s): Cherylynn RidgesJames O Kaula Klenke, MD  ASSISTANT: None  ANESTHESIA:   general  EBL: <20 ml  BLOOD ADMINISTERED: none  DRAINS: none   SPECIMEN:  Source of Specimen:  Gallbladder  COUNTS CORRECT:  YES  PROCEDURE DETAILS: The patient was taken to the operating room and placed on the table in the supine position.  After an adequate endotracheal anesthetic was administered, he was prepped with ChloroPrep, and then draped in the usual manner exposing the entire abdomen laterally, inferiorly and up  to the costal margins.  After a proper timeout was performed including identifying the patient and the procedure to be performed, a supra-umbilical 1.5cm midline incision was made using a #15 blade.  This was taken down to the fascia which was then incised with a #15 blade.  The edges of the fascia were tented up with Kocher clamps as the preperitoneal space was penetrated with a Kelly clamp into the peritoneum.  Once this was done, a pursestring suture of 0 Vicryl was passed around the fascial opening.  This was subsequently used to secure the [facility] which was passed into the peritoneal cavity.  Once the [facility] cannula was in place, carbon dioxide gas was insufflated into the peritoneal cavity up to a maximal intra-abdominal pressure of 15mm Hg.The laparoscope, with attached camera and light source, was passed into the peritoneal cavity to visualize the direct insertion of two right upper quadrant 5mm cannulas, and a sup-xiphoid 5mm cannula.  Once all cannulas were in place, the dissection was begun.  Two ratcheted graspers were attached to the dome and infundibulum of the gallbladder and retracted towards the anterior abdominal wall and the right  upper quadrant.  Using cautery attached to a dissecting forceps, the peritoneum overlaying the triangle of Chalot and the hepatoduodenal triangle was dissected away exposing the cystic duct and the cystic artery.  A clip was placed on the gallbladder side of the cystic duct, then the distal cystic duct was clipped multiple times then transected.  No cholangiogram was performed because the anatomy was good and the preoperative LFTs were normal.  The gallbladder was then dissected out of the hepatic bed without event.  It was retrieved from the abdomen (using an EndoCatch bag) without event.  Once the gallbladder was removed, the bed was inspected for hemostasis.  Once excellent hemostasis was obtained all gas and fluids were aspirated from above the liver, then the cannulas were removed.  The supra-umbilical incision was closed using the pursestring suture which was in place.  0.25% bupivicaine with epinephrine was injected at all sites.  All 10mm or greater cannula sites were close using a running subcuticular stitch of 4-0 Monocryl.  5.710mm cannula sites were closed with Dermabond only.Steri-Strips and Tagaderm were used to complete the dressings at all sites.  At this point all needle, sponge, and instrument counts were correct.The patient was awakened from anesthesia and taken to the PACU in stable condition.  PATIENT DISPOSITION:  PACU - hemodynamically stable.   Cherylynn RidgesWYATT, [place] Lebeau O 2/19/20159:51 AM

## 2013-11-01 NOTE — H&P (View-Only) (Signed)
Patient ID: Adrian Hampton, male   DOB: June 08, 1961, 53 y.o.   MRN: [id number]  Chief Complaint  Patient presents with  . New Evaluation    eval biliary dyskenisia    HPI Adrian Hampton is a 53 y.o. male.  Being evaluated for biliary dyskinesia.  HPI This patient has been having symptoms of severe nausea and vomiting dating back to October 2014. During December of 2014 his symptoms actually abated however they came back within the last 2 weeks, starting when he ate a hamburger at a [employer]. Since then he awakens with nausea and if he tries he did start vomiting profusely. He has multiple antinausea medications. A HIDA scan was done which demonstrated a biliary contraction of only 3.8%  . He was refer to surgery for evaluation. Past Medical History  Diagnosis Date  . Hypertension   . Thyroid disease     Past Surgical History  Procedure Laterality Date  . Thyroid surgery  2005    thyroidectomy    Family History  Problem Relation Age of Onset  . Cancer Mother     pancreatic    Social History History  Substance Use Topics  . Smoking status: Never Smoker   . Smokeless tobacco: Never Used  . Alcohol Use: Yes     Comment: socailly    No Known Allergies  Current Outpatient Prescriptions  Medication Sig Dispense Refill  . levothyroxine (SYNTHROID, LEVOTHROID) 112 MCG tablet Take 112 mcg by mouth daily.      Marland Kitchen lisinopril (PRINIVIL,ZESTRIL) 40 MG tablet Take 40 mg by mouth daily.      . ondansetron (ZOFRAN) 4 MG tablet Take 1 tablet (4 mg total) by mouth every 6 (six) hours.  12 tablet  0  . promethazine (PHENERGAN) 25 MG tablet Take 25 mg by mouth every 6 (six) hours as needed for nausea.       No current facility-administered medications for this visit.    Review of Systems Review of Systems  Gastrointestinal: Positive for nausea and vomiting.  All other systems reviewed and are negative.    Blood pressure 114/70, pulse 80, temperature 98.1 F (36.7 C),  temperature source Oral, resp. rate 14, height 5' 8.5" (1.74 m), weight 174 lb 3.2 oz (79.017 kg).  Physical Exam Physical Exam  Constitutional: He is oriented to person, place, and time. He appears well-developed and well-nourished.  HENT:  Head: Normocephalic and atraumatic.  Eyes: Conjunctivae and EOM are normal. Pupils are equal, round, and reactive to light.  Neck: Normal range of motion. Neck supple.  Cardiovascular: Normal rate, regular rhythm and normal heart sounds.   Pulmonary/Chest: Effort normal and breath sounds normal.  Abdominal: Soft. Bowel sounds are normal. He exhibits no distension. There is no tenderness.  Musculoskeletal: Normal range of motion.  Neurological: He is alert and oriented to person, place, and time. He has normal reflexes.  Skin: Skin is warm and dry.  Psychiatric: He has a normal mood and affect. His behavior is normal. Judgment and thought content normal.    Data Reviewed I have reviewed the patient's laboratory studies and his radiologic studies  Assessment    Biliary dyskinesia with significant symptoms of nausea vomiting. I believe that this is very likely causing the patient's problem.     Plan    Laparoscopic cholecystectomy with possible cholangiogram. I told the patient that based on his current symptoms and the HIDA scan That he should have significant improvement after surgery. The patient wants to  go ahead and schedule. The risk and benefits have been explained to the patient and he wishes to proceed.       Cherylynn RidgesWYATT, [place] Mallozzi O 10/16/2013, 12:38 PM

## 2013-11-01 NOTE — Discharge Instructions (Addendum)
Laparoscopic Cholecystectomy, Care After Refer to this sheet in the next few weeks. These instructions provide you with information on caring for yourself after your procedure. Your health care provider may also give you more specific instructions. Your treatment has been planned according to current medical practices, but problems sometimes occur. Call your health care provider if you have any problems or questions after your procedure. WHAT TO EXPECT AFTER THE PROCEDURE After your procedure, it is typical to have the following:  Pain at your incision sites. You will be given pain medicines to control the pain.  Mild nausea or vomiting. This should improve after the first 24 hours.  Bloating and possibly shoulder pain from the gas used during the procedure. This will improve after the first 24 hours. HOME CARE INSTRUCTIONS   Change bandages (dressings) as directed by your health care provider.  Keep the wound dry and clean. You may wash the wound gently with soap and water. Gently blot or dab the area dry.  Do not take baths or use swimming pools or hot tubs for 2 weeks or until your health care provider approves.  Only take over-the-counter or prescription medicines as directed by your health care provider.  Continue your normal diet as directed by your health care provider.  Do not lift anything heavier than 10 pounds (4.5 kg) until your health care provider approves.  Do not play contact sports for 1 week or until your health care provider approves  You may return to work in two weeks  Your wounds are covered with plastic dressings, so you can shower right away.Marland Kitchen. SEEK MEDICAL CARE IF:   You have redness, swelling, or increasing pain in the wound.  You notice yellowish-white fluid (pus) coming from the wound.  You have drainage from the wound that lasts longer than 1 day.  You notice a bad smell coming from the wound or dressing.  Your surgical cuts (incisions) break  open. SEEK IMMEDIATE MEDICAL CARE IF:   You develop a rash.  You have difficulty breathing.  You have chest pain.  You have a fever.  You have increasing pain in the shoulders (shoulder strap areas).  You have dizzy episodes or faint while standing.  You have severe abdominal pain.  You feel sick to your stomach (nauseous) or throw up (vomit) and this lasts for more than 1 day. Document Released: 08/30/2005 Document Revised: 06/20/2013 Document Reviewed: 04/11/2013 [facility] Patient Information 2014 [place], [place].    Post Anesthesia Home Care Instructions  Activity: Get plenty of rest for the remainder of the day. A responsible adult should stay with you for 24 hours following the procedure.  For the next 24 hours, DO NOT: -Drive a car -Advertising copywriterperate machinery -Drink alcoholic beverages -Take any medication unless instructed by your physician -Make any legal decisions or sign important papers.  Meals: Start with liquid foods such as gelatin or soup. Progress to regular foods as tolerated. Avoid greasy, spicy, heavy foods. If nausea and/or vomiting occur, drink only clear liquids until the nausea and/or vomiting subsides. Call your physician if vomiting continues.  Special Instructions/Symptoms: Your throat may feel dry or sore from the anesthesia or the breathing tube placed in your throat during surgery. If this causes discomfort, gargle with warm salt water. The discomfort should disappear within 24 hours.

## 2013-11-01 NOTE — Anesthesia Preprocedure Evaluation (Signed)
Anesthesia Evaluation  Patient identified by MRN, date of birth, ID band Patient awake    Reviewed: Allergy & Precautions, H&P , NPO status , Patient's Chart, lab work & pertinent test results  Airway Mallampati: II  Neck ROM: full    Dental   Pulmonary neg pulmonary ROS,          Cardiovascular hypertension,     Neuro/Psych    GI/Hepatic   Endo/Other    Renal/GU      Musculoskeletal   Abdominal   Peds  Hematology   Anesthesia Other Findings   Reproductive/Obstetrics                          Anesthesia Physical Anesthesia Plan  ASA: II  Anesthesia Plan: General   Post-op Pain Management:    Induction: Intravenous  Airway Management Planned: Oral ETT  Additional Equipment:   Intra-op Plan:   Post-operative Plan: Extubation in OR  Informed Consent: I have reviewed the patients History and Physical, chart, labs and discussed the procedure including the risks, benefits and alternatives for the proposed anesthesia with the patient or authorized representative who has indicated his/her understanding and acceptance.     Plan Discussed with: CRNA, Anesthesiologist and Surgeon  Anesthesia Plan Comments:         Anesthesia Quick Evaluation  

## 2013-11-01 NOTE — Anesthesia Postprocedure Evaluation (Signed)
Anesthesia Post Note  Patient: Adrian Hampton  Procedure(s) Performed: Procedure(s) (LRB): LAPAROSCOPIC CHOLECYSTECTOMY (N/A)  Anesthesia type: General  Patient location: PACU  Post pain: Pain level controlled and Adequate analgesia  Post assessment: Post-op Vital signs reviewed, Patient's Cardiovascular Status Stable, Respiratory Function Stable, Patent Airway and Pain level controlled  Last Vitals:  Filed Vitals:   11/01/13 1145  BP: 147/86  Pulse: 79  Temp:   Resp: 20    Post vital signs: Reviewed and stable  Level of consciousness: awake, alert  and oriented  Complications: No apparent anesthesia complications

## 2013-11-01 NOTE — Anesthesia Procedure Notes (Signed)
Procedure Name: Intubation Date/Time: 11/01/2013 8:48 AM Performed by: Burna CashONRAD, Inari Shin C Pre-anesthesia Checklist: Patient identified, Emergency Drugs available, Suction available and Patient being monitored Patient Re-evaluated:Patient Re-evaluated prior to inductionOxygen Delivery Method: Circle System Utilized Preoxygenation: Pre-oxygenation with 100% oxygen Intubation Type: IV induction Ventilation: Mask ventilation without difficulty Laryngoscope Size: Mac and 3 Grade View: Grade I Tube type: Oral Tube size: 8.0 mm Number of attempts: 1 Airway Equipment and Method: stylet and oral airway Placement Confirmation: ETT inserted through vocal cords under direct vision,  positive ETCO2 and breath sounds checked- equal and bilateral Secured at: 22 cm Tube secured with: Tape Dental Injury: Teeth and Oropharynx as per pre-operative assessment

## 2013-11-01 NOTE — Interval H&P Note (Signed)
History and Physical Interval Note:  11/01/2013 8:39 AM  Adrian RayJoel A Mclester  has presented today for surgery, with the diagnosis of biliary dyskinesia   The various methods of treatment have been discussed with the patient and family. After consideration of risks, benefits and other options for treatment, the patient has consented to  Procedure(s): LAPAROSCOPIC CHOLECYSTECTOMY possible IOC (N/A) as a surgical intervention .  The patient's history has been reviewed, patient examined, no change in status, stable for surgery.  I have reviewed the patient's chart and labs.  Questions were answered to the patient's satisfaction.     Lauralie Blacksher, Marta LamasJAMES O

## 2013-11-02 ENCOUNTER — Encounter (INDEPENDENT_AMBULATORY_CARE_PROVIDER_SITE_OTHER): Payer: Self-pay

## 2013-11-02 ENCOUNTER — Encounter (HOSPITAL_BASED_OUTPATIENT_CLINIC_OR_DEPARTMENT_OTHER): Payer: Self-pay | Admitting: General Surgery

## 2013-11-05 ENCOUNTER — Telehealth (INDEPENDENT_AMBULATORY_CARE_PROVIDER_SITE_OTHER): Payer: Self-pay

## 2013-11-05 NOTE — Telephone Encounter (Signed)
Patient states he still is having nausea . He has not been taking phenergan or Zofran. Denies constipation, diarrhea , normal BMS  Advised him to take his Zofran and to start clear liquids for 24 hrs to give him time to adjust to food again. Patient verbalized understanding

## 2013-11-13 ENCOUNTER — Encounter (INDEPENDENT_AMBULATORY_CARE_PROVIDER_SITE_OTHER): Payer: Self-pay | Admitting: General Surgery

## 2013-11-13 ENCOUNTER — Ambulatory Visit (INDEPENDENT_AMBULATORY_CARE_PROVIDER_SITE_OTHER): Payer: Managed Care, Other (non HMO) | Admitting: General Surgery

## 2013-11-13 ENCOUNTER — Encounter (INDEPENDENT_AMBULATORY_CARE_PROVIDER_SITE_OTHER): Payer: Self-pay

## 2013-11-13 VITALS — BP 136/82 | HR 72 | Temp 98.6°F | Resp 14 | Ht 68.5 in | Wt 166.8 lb

## 2013-11-13 DIAGNOSIS — Z09 Encounter for follow-up examination after completed treatment for conditions other than malignant neoplasm: Secondary | ICD-10-CM

## 2013-11-13 NOTE — Progress Notes (Signed)
Subjective:     Patient ID: Adrian RayJoel A Hampton, male   DOB: 1960-11-08, 53 y.o.   MRN: [id number]  HPI The patient is doing well status post laparoscopic cholecystectomy. The frequency of his nausea has significantly improved. He is no longer taking any antinausea medication. His a.m. Appetite is not that great but he eats well throughout the day.   Review of Systems No fevers, chills, jaundice, or vomiting    Objective:   Physical Exam His wounds have healed well with no evidence of infection. I removed one of his trocar site dressings which seemed to be saturated with blood and underneath it appeared to be just fine the    Assessment:     Doing well status post laparoscopic cholecystectomy.     Plan:     Return to see me on an as-needed basis.  Will allow the patient go back to work on Monday, 11/19/2013.

## 2013-11-15 ENCOUNTER — Encounter (INDEPENDENT_AMBULATORY_CARE_PROVIDER_SITE_OTHER): Payer: Self-pay

## 2013-11-20 ENCOUNTER — Encounter (INDEPENDENT_AMBULATORY_CARE_PROVIDER_SITE_OTHER): Payer: Self-pay | Admitting: General Surgery

## 2013-11-20 ENCOUNTER — Encounter (INDEPENDENT_AMBULATORY_CARE_PROVIDER_SITE_OTHER): Payer: Self-pay

## 2013-11-20 ENCOUNTER — Telehealth (INDEPENDENT_AMBULATORY_CARE_PROVIDER_SITE_OTHER): Payer: Self-pay

## 2013-11-20 ENCOUNTER — Ambulatory Visit (INDEPENDENT_AMBULATORY_CARE_PROVIDER_SITE_OTHER): Payer: Managed Care, Other (non HMO) | Admitting: General Surgery

## 2013-11-20 VITALS — BP 134/96 | HR 86 | Temp 99.9°F | Resp 16 | Ht 68.5 in | Wt 166.2 lb

## 2013-11-20 DIAGNOSIS — R112 Nausea with vomiting, unspecified: Secondary | ICD-10-CM

## 2013-11-20 DIAGNOSIS — K828 Other specified diseases of gallbladder: Secondary | ICD-10-CM

## 2013-11-20 LAB — CBC WITH DIFFERENTIAL/PLATELET
Basophils Absolute: 0 10*3/uL (ref 0.0–0.1)
Eosinophils Absolute: 0 10*3/uL (ref 0.0–0.7)
HEMATOCRIT: 45.5 % (ref 39.0–52.0)
HEMOGLOBIN: 14.9 g/dL (ref 13.0–17.0)
LYMPHS PCT: 29 % (ref 12–46)
Lymphs Abs: 1.7 10*3/uL (ref 0.7–4.0)
MCH: 30.1 pg (ref 26.0–34.0)
MCHC: 32.8 g/dL (ref 30.0–36.0)
MCV: 92 fL (ref 78.0–100.0)
MONO ABS: 0.2 10*3/uL (ref 0.1–1.0)
MONOS PCT: 3 % (ref 3–12)
NEUTROS ABS: 3.9 10*3/uL (ref 1.7–7.7)
Neutrophils Relative %: 68 % (ref 43–77)
Platelets: 387 10*3/uL (ref 150–400)
RBC: 4.95 MIL/uL (ref 4.22–5.81)
RDW: 12.6 % (ref 11.5–15.5)
WBC: 5.8 10*3/uL (ref 4.0–10.5)

## 2013-11-20 LAB — COMPREHENSIVE METABOLIC PANEL
ALT: 49 U/L (ref 0–53)
AST: 38 U/L — ABNORMAL HIGH (ref 0–37)
Albumin: 4.4 g/dL (ref 3.5–5.2)
Alkaline Phosphatase: 79 U/L (ref 39–117)
BILIRUBIN TOTAL: 0.6 mg/dL (ref 0.2–1.2)
BUN: 11 mg/dL (ref 6–23)
CO2: 25 meq/L (ref 19–32)
CREATININE: 1 mg/dL (ref 0.50–1.35)
Calcium: 9.8 mg/dL (ref 8.4–10.5)
Chloride: 105 mEq/L (ref 96–112)
GLUCOSE: 115 mg/dL — AB (ref 70–99)
Potassium: 4.4 mEq/L (ref 3.5–5.3)
Sodium: 142 mEq/L (ref 135–145)
Total Protein: 7.9 g/dL (ref 6.0–8.3)

## 2013-11-20 NOTE — Telephone Encounter (Signed)
Returned pt's call. I spoke to Dr Carolynne Edouardoth about the pt and he said with the pt not having any abdominal pain that he would just be fine with prescribing some suppositories if the pt would like this to help. The pt really is worried about this and would like to be seen by someone today. I advised pt that I could bring him into the urgent office and send him to get labs drawn STAT this am per Dr Carolynne Edouardoth. The pt mentioned to me that the symptoms that he is having is what he was dealing with before the surgery and Dr Elnoria HowardHung sent him to us b/c of the nausea/vomiting. I advised pt that he might want to check with Dr Elnoria HowardHung again about getting an appt. The pt said he called DR Hung's office today but Dr Elnoria HowardHung is in procedures all day so the pt wants to be checked by us first and then he will f/u with Dr Elnoria HowardHung if needed after this visit. [facility] to get CBC and CMET per Dr Carolynne Edouardoth.

## 2013-11-20 NOTE — Patient Instructions (Signed)
Avoid MSG.  (monosodium glutamate).  Call us for fever/chills/abdominal pain.  Follow up with PCP for BP.

## 2013-11-20 NOTE — Telephone Encounter (Signed)
Pt calling in b/c he is having nausea and vomiting this am. Pt had a lap chole done by Dr Lindie SpruceWyatt on 11/01/13 and the pt has been doing fine from surgery. The pt returned to work full time yesterday and it went well. The pt woke up this am feeling nauseated and vomited 3 x's this am. I advised pt that I would need to check with a physician to see about getting some Phenergan but the pt already has Phenergan but not able to keep it down. I advised that I would check to see about some suppositories. Dr Lindie SpruceWyatt is not in the office so I will check with another physician and call pt back.

## 2013-11-21 NOTE — Assessment & Plan Note (Signed)
Pt does not appear to have surgical complication.  Nausea appears to be similar to pre op associated with sensation of heart pounding and dizziness.  ? HTN?   BP up here today, and has been up at home.    Do not feel that we need to get scan.  Benign abdominal exam, normal physiology, good labs.  Follow up as needed with surgery.

## 2013-11-21 NOTE — Progress Notes (Signed)
HISTORY: Pt is a 53 yo male approximately 3 weeks s/p lap chole who started having significant nausea over the weekend.  He denies fever/chills.  He is not having any abdominal pain.  He denies diarrhea or constipation.  He is not having any bloating.  He also states this his BP has been higher, and he feels his heart racing along with some dizziness.  He states that his diastolic BP had been over 100 multiple times with the nausea.      EXAM: General:  Alert and oriented.  Well appearing Incision:  abd soft, nt nd, incisions d/c/i.  No rebound or guarding.     PATHOLOGY: Gallbladder, and contents - CHRONIC CHOLECYSTITIS.   ASSESSMENT AND PLAN:   Biliary dyskinesia Pt does not appear to have surgical complication.  Nausea appears to be similar to pre op associated with sensation of heart pounding and dizziness.  ? HTN?   BP up here today, and has been up at home.    Do not feel that we need to get scan.  Benign abdominal exam, normal physiology, good labs.  Follow up as needed with surgery.        Maudry DiegoFaera L Alaynah Schutter, MD Surgical Oncology, General & Endocrine Surgery [facility]  Johny BlamerHARRIS, WILLIAM, MD Johny BlamerHarris, William, MD

## 2014-06-10 ENCOUNTER — Other Ambulatory Visit: Payer: Self-pay | Admitting: Gastroenterology

## 2014-06-10 DIAGNOSIS — R112 Nausea with vomiting, unspecified: Secondary | ICD-10-CM

## 2014-06-10 DIAGNOSIS — R51 Headache: Secondary | ICD-10-CM

## 2014-06-10 DIAGNOSIS — G8929 Other chronic pain: Secondary | ICD-10-CM

## 2014-06-14 ENCOUNTER — Other Ambulatory Visit: Payer: Managed Care, Other (non HMO)

## 2014-06-17 ENCOUNTER — Other Ambulatory Visit: Payer: Managed Care, Other (non HMO)

## 2015-05-27 ENCOUNTER — Ambulatory Visit: Payer: Managed Care, Other (non HMO) | Admitting: Cardiovascular Disease

## 2018-06-06 DIAGNOSIS — H9311 Tinnitus, right ear: Secondary | ICD-10-CM | POA: Diagnosis not present

## 2018-06-06 DIAGNOSIS — H9201 Otalgia, right ear: Secondary | ICD-10-CM | POA: Diagnosis not present

## 2018-06-06 DIAGNOSIS — Z0110 Encounter for examination of ears and hearing without abnormal findings: Secondary | ICD-10-CM | POA: Diagnosis not present

## 2018-06-23 DIAGNOSIS — Z23 Encounter for immunization: Secondary | ICD-10-CM | POA: Diagnosis not present

## 2018-07-18 DIAGNOSIS — E7800 Pure hypercholesterolemia, unspecified: Secondary | ICD-10-CM | POA: Diagnosis not present

## 2018-07-18 DIAGNOSIS — I10 Essential (primary) hypertension: Secondary | ICD-10-CM | POA: Diagnosis not present

## 2018-07-18 DIAGNOSIS — E039 Hypothyroidism, unspecified: Secondary | ICD-10-CM | POA: Diagnosis not present

## 2018-07-18 DIAGNOSIS — N5202 Corporo-venous occlusive erectile dysfunction: Secondary | ICD-10-CM | POA: Diagnosis not present

## 2018-07-18 DIAGNOSIS — Z1211 Encounter for screening for malignant neoplasm of colon: Secondary | ICD-10-CM | POA: Diagnosis not present

## 2018-07-18 DIAGNOSIS — Z23 Encounter for immunization: Secondary | ICD-10-CM | POA: Diagnosis not present

## 2018-07-18 DIAGNOSIS — Z125 Encounter for screening for malignant neoplasm of prostate: Secondary | ICD-10-CM | POA: Diagnosis not present

## 2018-07-18 DIAGNOSIS — Z0000 Encounter for general adult medical examination without abnormal findings: Secondary | ICD-10-CM | POA: Diagnosis not present

## 2018-07-24 DIAGNOSIS — E890 Postprocedural hypothyroidism: Secondary | ICD-10-CM | POA: Diagnosis not present

## 2019-02-21 DIAGNOSIS — I10 Essential (primary) hypertension: Secondary | ICD-10-CM | POA: Diagnosis not present

## 2019-02-21 DIAGNOSIS — E7800 Pure hypercholesterolemia, unspecified: Secondary | ICD-10-CM | POA: Diagnosis not present

## 2019-02-21 DIAGNOSIS — E039 Hypothyroidism, unspecified: Secondary | ICD-10-CM | POA: Diagnosis not present

## 2019-02-21 DIAGNOSIS — N5202 Corporo-venous occlusive erectile dysfunction: Secondary | ICD-10-CM | POA: Diagnosis not present

## 2019-03-21 DIAGNOSIS — N418 Other inflammatory diseases of prostate: Secondary | ICD-10-CM | POA: Diagnosis not present

## 2019-05-31 DIAGNOSIS — R3982 Chronic bladder pain: Secondary | ICD-10-CM | POA: Diagnosis not present

## 2019-05-31 DIAGNOSIS — N401 Enlarged prostate with lower urinary tract symptoms: Secondary | ICD-10-CM | POA: Diagnosis not present

## 2019-05-31 DIAGNOSIS — R8271 Bacteriuria: Secondary | ICD-10-CM | POA: Diagnosis not present

## 2019-05-31 DIAGNOSIS — R350 Frequency of micturition: Secondary | ICD-10-CM | POA: Diagnosis not present

## 2019-07-23 DIAGNOSIS — Z125 Encounter for screening for malignant neoplasm of prostate: Secondary | ICD-10-CM | POA: Diagnosis not present

## 2019-07-23 DIAGNOSIS — E7800 Pure hypercholesterolemia, unspecified: Secondary | ICD-10-CM | POA: Diagnosis not present

## 2019-07-23 DIAGNOSIS — E890 Postprocedural hypothyroidism: Secondary | ICD-10-CM | POA: Diagnosis not present

## 2019-07-23 DIAGNOSIS — I10 Essential (primary) hypertension: Secondary | ICD-10-CM | POA: Diagnosis not present

## 2019-07-24 DIAGNOSIS — E890 Postprocedural hypothyroidism: Secondary | ICD-10-CM | POA: Diagnosis not present

## 2019-07-25 DIAGNOSIS — R739 Hyperglycemia, unspecified: Secondary | ICD-10-CM | POA: Diagnosis not present

## 2019-07-25 DIAGNOSIS — I10 Essential (primary) hypertension: Secondary | ICD-10-CM | POA: Diagnosis not present

## 2019-07-25 DIAGNOSIS — E7800 Pure hypercholesterolemia, unspecified: Secondary | ICD-10-CM | POA: Diagnosis not present

## 2019-07-25 DIAGNOSIS — Z0000 Encounter for general adult medical examination without abnormal findings: Secondary | ICD-10-CM | POA: Diagnosis not present

## 2019-07-25 DIAGNOSIS — N5202 Corporo-venous occlusive erectile dysfunction: Secondary | ICD-10-CM | POA: Diagnosis not present

## 2019-07-25 DIAGNOSIS — E039 Hypothyroidism, unspecified: Secondary | ICD-10-CM | POA: Diagnosis not present

## 2019-07-25 DIAGNOSIS — Z1211 Encounter for screening for malignant neoplasm of colon: Secondary | ICD-10-CM | POA: Diagnosis not present

## 2019-08-14 DIAGNOSIS — R739 Hyperglycemia, unspecified: Secondary | ICD-10-CM | POA: Diagnosis not present

## 2019-08-28 DIAGNOSIS — E1165 Type 2 diabetes mellitus with hyperglycemia: Secondary | ICD-10-CM | POA: Diagnosis not present

## 2019-10-09 DIAGNOSIS — Z7189 Other specified counseling: Secondary | ICD-10-CM | POA: Diagnosis not present

## 2019-10-09 DIAGNOSIS — Z20828 Contact with and (suspected) exposure to other viral communicable diseases: Secondary | ICD-10-CM | POA: Diagnosis not present

## 2019-10-10 DIAGNOSIS — Z03818 Encounter for observation for suspected exposure to other biological agents ruled out: Secondary | ICD-10-CM | POA: Diagnosis not present

## 2019-10-11 ENCOUNTER — Encounter: Payer: Self-pay | Admitting: Cardiology

## 2019-10-11 ENCOUNTER — Other Ambulatory Visit: Payer: Self-pay

## 2019-10-11 ENCOUNTER — Encounter (INDEPENDENT_AMBULATORY_CARE_PROVIDER_SITE_OTHER): Payer: Self-pay

## 2019-10-11 ENCOUNTER — Ambulatory Visit (INDEPENDENT_AMBULATORY_CARE_PROVIDER_SITE_OTHER): Payer: 59 | Admitting: Cardiology

## 2019-10-11 DIAGNOSIS — E785 Hyperlipidemia, unspecified: Secondary | ICD-10-CM | POA: Diagnosis not present

## 2019-10-11 DIAGNOSIS — E1169 Type 2 diabetes mellitus with other specified complication: Secondary | ICD-10-CM | POA: Diagnosis not present

## 2019-10-11 DIAGNOSIS — E119 Type 2 diabetes mellitus without complications: Secondary | ICD-10-CM | POA: Diagnosis not present

## 2019-10-11 DIAGNOSIS — I10 Essential (primary) hypertension: Secondary | ICD-10-CM | POA: Diagnosis not present

## 2019-10-11 DIAGNOSIS — Z8249 Family history of ischemic heart disease and other diseases of the circulatory system: Secondary | ICD-10-CM

## 2019-10-11 NOTE — Patient Instructions (Signed)
Medication Instructions:  No changes   *If you need a refill on your cardiac medications before your next appointment, please call your pharmacy*   Lab Work: Not needed   Testing/Procedures:  CT coronary calcium score. This test is done at [address]. This is $150 out of pocket.   Coronary CalciumScan A coronary calcium scan is an imaging test used to look for deposits of calcium and other fatty materials (plaques) in the inner lining of the blood vessels of the heart (coronary arteries). These deposits of calcium and plaques can partly clog and narrow the coronary arteries without producing any symptoms or warning signs. This puts a person at risk for a heart attack. This test can detect these deposits before symptoms develop. Tell a health care provider about:  Any allergies you have.  All medicines you are taking, including vitamins, herbs, eye drops, creams, and over-the-counter medicines.  Any problems you or family members have had with anesthetic medicines.  Any blood disorders you have.  Any surgeries you have had.  Any medical conditions you have.  Whether you are pregnant or may be pregnant. What are the risks? Generally, this is a safe procedure. However, problems may occur, including:  Harm to a pregnant woman and her unborn baby. This test involves the use of radiation. Radiation exposure can be dangerous to a pregnant woman and her unborn baby. If you are pregnant, you generally should not have this procedure done.  Slight increase in the risk of cancer. This is because of the radiation involved in the test. What happens before the procedure? No preparation is needed for this procedure. What happens during the procedure?  You will undress and remove any jewelry around your neck or chest.  You will put on a hospital gown.  Sticky electrodes will be placed on your chest. The electrodes will be connected to an electrocardiogram (ECG)  machine to record a tracing of the electrical activity of your heart.  A CT scanner will take pictures of your heart. During this time, you will be asked to lie still and hold your breath for 2-3 seconds while a picture of your heart is being taken. The procedure may vary among health care providers and hospitals. What happens after the procedure?  You can get dressed.  You can return to your normal activities.  It is up to you to get the results of your test. Ask your health care provider, or the department that is doing the test, when your results will be ready. Summary  A coronary calcium scan is an imaging test used to look for deposits of calcium and other fatty materials (plaques) in the inner lining of the blood vessels of the heart (coronary arteries).  Generally, this is a safe procedure. Tell your health care provider if you are pregnant or may be pregnant.  No preparation is needed for this procedure.  A CT scanner will take pictures of your heart.  You can return to your normal activities after the scan is done. This information is not intended to replace advice given to you by your health care provider. Make sure you discuss any questions you have with your health care provider. Document Released: 02/26/2008 Document Revised: 07/19/2016 Document Reviewed: 07/19/2016 Elsevier Interactive Patient Education  2017 Elsevier Inc.    Follow-Up: At [facility], you and your health needs are our priority.  As part of our continuing mission to provide you with exceptional heart care, we have   created designated Provider Care Teams.  These Care Teams include your primary Cardiologist (physician) and Advanced Practice Providers (APPs -  Physician Assistants and Nurse Practitioners) who all work together to provide you with the care you need, when you need it.     Your next appointment:   12 month(s)  The format for your next appointment:   In Person  Provider:   David  Harding, MD    

## 2019-10-11 NOTE — Progress Notes (Signed)
Primary Care Provider: Johny Blamer, MD Cardiologist: No primary care provider on file. Electrophysiologist: None  Clinic Note: Chief Complaint  Patient presents with  . New Patient (Initial Visit)    Cardiac Risk Factors     HPI:    Adrian Hampton is a 59 y.o. male with a history of recently DIAGNOSED DM-2, HYPERTENSION and borderline HYPERLIPIDEMIA who is being seen today for the evaluation of CARDIAC RISK FACTORS with FAMILY HISTORY of CAD at the request of Johny Blamer, MD.  Adrian Hampton was recently seen by Dr. Tiburcio Pea on December 15 for evaluation after diagnosis of diabetes with an A1c of 8.6.  Patient acknowledged dietary discretion and is hoping to control his diabetes and hyperlipidemia with lifestyle modification.  Because of his family history with grandfather having premature CAD, he is asked for based on cardiology evaluation.  Recent Hospitalizations: None  Reviewed  CV studies:    The following studies were reviewed today: (if available, images/films reviewed: From Epic Chart or Care Everywhere) . None:   Interval History:   Adrian Hampton is an otherwise healthy-appearing middle-aged gentleman who is admittedly not as active as he probably should be, but is try to get back and exercise.  He actually is lost about 10 pounds in the last month or so since his diagnosis of diabetes.  He is try to work at least 4 to 5 days a week and made significant dietary changes.  He has notably reduced his alcohol intake 1 or 2 drinks a week as opposed to 1 drinks a night.  He denies any active cardiac symptoms of angina, heart failure or arrhythmia.  CV Review of Symptoms (Summary): no chest pain or dyspnea on exertion negative for - edema, irregular heartbeat, orthopnea, palpitations, paroxysmal nocturnal dyspnea, rapid heart rate, shortness of breath or Syncope/near syncope, TIA/amaurosis fugax, claudication  The patient does not have symptoms concerning for COVID-19  infection (fever, chills, cough, or new shortness of breath).  The patient is practicing social distancing & Masking.    REVIEWED OF SYSTEMS   A comprehensive ROS was performed. Review of Systems  Constitutional: Positive for weight loss (Intentional). Negative for malaise/fatigue.  HENT: Negative for congestion and nosebleeds.   Respiratory: Negative for shortness of breath and wheezing.   Gastrointestinal: Negative for blood in stool, constipation, heartburn and melena.  Genitourinary: Negative for hematuria.  Musculoskeletal: Positive for joint pain (Normal for age). Negative for myalgias.  Neurological: Negative for dizziness, focal weakness and headaches.  Psychiatric/Behavioral: Negative for memory loss. The patient is not nervous/anxious and does not have insomnia.   All other systems reviewed and are negative.  I have reviewed and (if needed) personally updated the patient's problem list, medications, allergies, past medical and surgical history, social and family history.   PAST MEDICAL HISTORY   Past Medical History:  Diagnosis Date  . Diabetes mellitus type II, non insulin dependent (HCC)   . History of vertigo   . Hyperlipidemia associated with type 2 diabetes mellitus (HCC)   . Hypertension   . Thyroid disease     PAST SURGICAL HISTORY   Past Surgical History:  Procedure Laterality Date  . CHOLECYSTECTOMY N/A 11/01/2013   Procedure: LAPAROSCOPIC CHOLECYSTECTOMY;  Surgeon: Cherylynn Ridges, MD;  Location: [facility];  Service: General;  Laterality: N/A;  . COLONOSCOPY    . THYROID SURGERY  2005   thyroidectomy  . UPPER GI ENDOSCOPY      MEDICATIONS/ALLERGIES   No  outpatient medications have been marked as taking for the 10/11/19 encounter (Office Visit) with Marykay Lex, MD.    No Known Allergies  SOCIAL HISTORY/FAMILY HISTORY   Social History   Tobacco Use  . Smoking status: Never Smoker  . Smokeless tobacco: Never Used  Substance  Use Topics  . Alcohol use: Yes    Alcohol/week: 4.0 standard drinks    Types: 4 Standard drinks or equivalent per week    Comment: socailly  . Drug use: No   Social History   Social History Narrative  . Not on file    Family History family history includes Alcoholism in his father; Alzheimer's disease in his maternal grandmother; CAD in his paternal grandfather; Diabetes Mellitus II in his mother; Healthy in his brother and sister; Liver disease in his father; Ovarian cancer in his paternal grandmother; Pancreatic cancer (age of onset: 45) in his mother; Stomach cancer in his maternal grandmother.  OBJCTIVE -PE, EKG, labs   Wt Readings from Last 3 Encounters:  10/11/19 178 lb 12.8 oz (81.1 kg)  11/20/13 166 lb 3.2 oz (75.4 kg)  11/13/13 166 lb 12.8 oz (75.7 kg)    Physical Exam: BP (!) 142/86   Pulse 76   Ht 5\' 8"  (1.727 m)   Wt 178 lb 12.8 oz (81.1 kg)   BMI 27.19 kg/m  Physical Exam  Constitutional: He is oriented to person, place, and time. He appears well-developed and well-nourished. No distress.  Healthy-appearing gentleman.  Well-groomed.  HENT:  Head: Normocephalic and atraumatic.  Eyes: Pupils are equal, round, and reactive to light. Conjunctivae and EOM are normal.  Neck: No hepatojugular reflux and no JVD present. Carotid bruit is not present. No tracheal deviation present. No thyromegaly present.  Cardiovascular: Normal rate, regular rhythm, normal heart sounds and normal pulses.  No extrasystoles are present. PMI is not displaced. Exam reveals no gallop and no friction rub.  No murmur heard. Pulmonary/Chest: Effort normal and breath sounds normal. No respiratory distress. He has no wheezes. He has no rales.  Abdominal: Soft. Bowel sounds are normal. He exhibits no distension. There is no abdominal tenderness. There is no rebound.  Musculoskeletal:        General: No edema. Normal range of motion.     Cervical back: Normal range of motion and neck supple.    Neurological: He is alert and oriented to person, place, and time. No cranial nerve deficit.  Skin: Skin is warm and dry.  Psychiatric: He has a normal mood and affect. Judgment and thought content normal.  Vitals reviewed.    Adult ECG Report  Rate: 76 ;  Rhythm: normal sinus rhythm and Normal axis, intervals and durations.;   Narrative Interpretation: Normal EKG  Recent Labs:   08/02/2019 -> TC 196, TG 125, HDL 37, LDL 34.  A1c 8.6.  Glu 140.  Cr 1.23.  K+ 4.3.  TSH 0.59. CBC: W6.1,  Hgb 15.0,PLT 389.  No results found for: CHOL, HDL, LDLCALC, LDLDIRECT, TRIG, CHOLHDL  ASSESSMENT/PLAN    Problem List Items Addressed This Visit    Type 2 diabetes mellitus without complication, without long-term current use of insulin (HCC) (Chronic)    Recent diagnosis.  Has plan is to try to treat this with titrate modification and exercise with weight loss.  Relatively new diagnosis.  Hopefully he can get this under control.      Relevant Orders   CT CARDIAC SCORING   Hyperlipidemia associated with type 2 diabetes mellitus (HCC) (Chronic)  Most recent labs showed pretty high LDL.  He acknowledges dietary discretion and has gained weight.  With dietary change and increase exercise is already lost 10 pounds in the last month or 2.  Continue diet and exercise adjustment.  Hopefully, he will be able to avoid medical therapy, however noted to determine based on cardiovascular risk with family history and borderline metabolic syndrome, we will do a baseline evaluation with coronary calcium score.  Plan: CORONARY CALCIUM      Family history of premature CAD (Chronic)    Notable risk factors, but asymptomatic.  He is very active and has not had any anginal symptoms.  Plan: Baseline assessment with CORONARY CALCIUM SCORE      Relevant Orders   EKG 12-Lead (Completed)   CT CARDIAC SCORING   Essential hypertension (Chronic)    Blood pressure looks pretty good, borderline elevated today but  usually is better than this.  He is notably somewhat stressed today. He is on lisinopril alone.  Next line would be diuretic.      Relevant Orders   EKG 12-Lead (Completed)   CT CARDIAC SCORING       COVID-19 Education: The signs and symptoms of COVID-19 were discussed with the patient and how to seek care for testing (follow up with PCP or arrange E-visit).   The importance of social distancing was discussed today.  I spent a total of 22 minutes with the patient. >  50% of the time was spent in direct patient consultation.  Additional time spent with chart review  / charting (studies, outside notes, etc): 10 Total Time: 32 min   Current medicines are reviewed at length with the patient today.  (+/- concerns) n/a   Patient Instructions / Medication Changes & Studies & Tests Ordered   Patient Instructions  Medication Instructions:  No changes *If you need a refill on your cardiac medications before your next appointment, please call your pharmacy*  Lab Work: Not needed  Testing/Procedures:  CT coronary calcium score. This test is done at [address]. This is $150 out of pocket.  Follow-Up: At [facility], you and your health needs are our priority.  As part of our continuing mission to provide you with exceptional heart care, we have created designated Provider Care Teams.  These Care Teams include your primary Cardiologist (physician) and Advanced Practice Providers (APPs -  Physician Assistants and Nurse Practitioners) who all work together to provide you with the care you need, when you need it.  Your next appointment:   12 month(s)  The format for your next appointment:   In Person  Provider:   Glenetta Hew, MD      Studies Ordered:   Orders Placed This Encounter  Procedures  . CT CARDIAC SCORING  . EKG 12-Lead     Glenetta Hew, M.D., M.S. Interventional Cardiologist   Pager # [phone] Phone # [phone] [address] [place], [place] [address]   Thank you for choosing Heartcare at [facility]!!

## 2019-10-14 ENCOUNTER — Encounter: Payer: Self-pay | Admitting: Cardiology

## 2019-10-14 NOTE — Assessment & Plan Note (Signed)
Most recent labs showed pretty high LDL.  He acknowledges dietary discretion and has gained weight.  With dietary change and increase exercise is already lost 10 pounds in the last month or 2.  Continue diet and exercise adjustment.  Hopefully, he will be able to avoid medical therapy, however noted to determine based on cardiovascular risk with family history and borderline metabolic syndrome, we will do a baseline evaluation with coronary calcium score.  Plan: CORONARY CALCIUM

## 2019-10-14 NOTE — Assessment & Plan Note (Signed)
Recent diagnosis.  Has plan is to try to treat this with titrate modification and exercise with weight loss.  Relatively new diagnosis.  Hopefully he can get this under control.

## 2019-10-14 NOTE — Assessment & Plan Note (Signed)
Blood pressure looks pretty good, borderline elevated today but usually is better than this.  He is notably somewhat stressed today. He is on lisinopril alone.  Next line would be diuretic.

## 2019-10-14 NOTE — Assessment & Plan Note (Signed)
Notable risk factors, but asymptomatic.  He is very active and has not had any anginal symptoms.  Plan: Baseline assessment with CORONARY CALCIUM SCORE

## 2019-10-20 DIAGNOSIS — Z13220 Encounter for screening for lipoid disorders: Secondary | ICD-10-CM | POA: Diagnosis not present

## 2019-10-20 DIAGNOSIS — Z131 Encounter for screening for diabetes mellitus: Secondary | ICD-10-CM | POA: Diagnosis not present

## 2019-10-20 DIAGNOSIS — I10 Essential (primary) hypertension: Secondary | ICD-10-CM | POA: Diagnosis not present

## 2019-10-20 DIAGNOSIS — Z713 Dietary counseling and surveillance: Secondary | ICD-10-CM | POA: Diagnosis not present

## 2019-10-22 ENCOUNTER — Other Ambulatory Visit: Payer: Self-pay

## 2019-10-22 ENCOUNTER — Ambulatory Visit (INDEPENDENT_AMBULATORY_CARE_PROVIDER_SITE_OTHER)
Admission: RE | Admit: 2019-10-22 | Discharge: 2019-10-22 | Disposition: A | Discharge status: Home / Self Care | Payer: Self-pay | Source: Ambulatory Visit | Attending: Cardiology | Admitting: Cardiology

## 2019-10-22 DIAGNOSIS — E119 Type 2 diabetes mellitus without complications: Secondary | ICD-10-CM

## 2019-10-22 DIAGNOSIS — I10 Essential (primary) hypertension: Secondary | ICD-10-CM

## 2019-10-22 DIAGNOSIS — Z8249 Family history of ischemic heart disease and other diseases of the circulatory system: Secondary | ICD-10-CM

## 2019-11-16 DIAGNOSIS — E119 Type 2 diabetes mellitus without complications: Secondary | ICD-10-CM | POA: Diagnosis not present

## 2019-12-03 DIAGNOSIS — Z1159 Encounter for screening for other viral diseases: Secondary | ICD-10-CM | POA: Diagnosis not present

## 2019-12-06 DIAGNOSIS — Z1211 Encounter for screening for malignant neoplasm of colon: Secondary | ICD-10-CM | POA: Diagnosis not present

## 2019-12-06 DIAGNOSIS — K5730 Diverticulosis of large intestine without perforation or abscess without bleeding: Secondary | ICD-10-CM | POA: Diagnosis not present

## 2019-12-06 DIAGNOSIS — D123 Benign neoplasm of transverse colon: Secondary | ICD-10-CM | POA: Diagnosis not present

## 2019-12-13 DIAGNOSIS — D123 Benign neoplasm of transverse colon: Secondary | ICD-10-CM | POA: Diagnosis not present

## 2019-12-17 DIAGNOSIS — M25562 Pain in left knee: Secondary | ICD-10-CM | POA: Diagnosis not present

## 2019-12-17 DIAGNOSIS — M25511 Pain in right shoulder: Secondary | ICD-10-CM | POA: Diagnosis not present

## 2020-01-23 DIAGNOSIS — E7800 Pure hypercholesterolemia, unspecified: Secondary | ICD-10-CM | POA: Diagnosis not present

## 2020-01-23 DIAGNOSIS — I10 Essential (primary) hypertension: Secondary | ICD-10-CM | POA: Diagnosis not present

## 2020-01-23 DIAGNOSIS — E1169 Type 2 diabetes mellitus with other specified complication: Secondary | ICD-10-CM | POA: Diagnosis not present

## 2020-01-23 DIAGNOSIS — E039 Hypothyroidism, unspecified: Secondary | ICD-10-CM | POA: Diagnosis not present

## 2020-04-02 IMAGING — CT CT CARDIAC CORONARY ARTERY CALCIUM SCORE
3 series · 14 of 20 positions shown, 15 images · non-contrast
Comparison: None.

Addendum:
CLINICAL DATA: Risk stratification

EXAM:
Coronary Calcium Score
TECHNIQUE: The patient was scanned on a Siemens Somatom 64 slice scanner. Axial
non-contrast 3mm slices were carried out through the heart. The data
set was analyzed on a dedicated work station and scored using the
Agatson method.

[Series 2: casc 3.0 bv41 2 bestdiast 71 % · axial · 0.40mm/px · z∈[-248,-182]mm · 4 of 38 slices shown, 5 images]
[im 8/38  vessel]
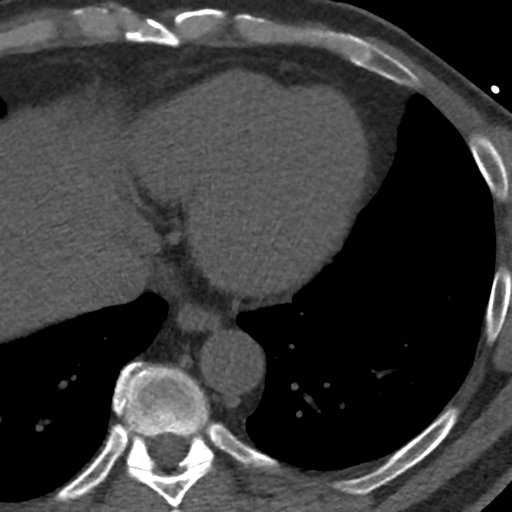
[im 8/38  lung]
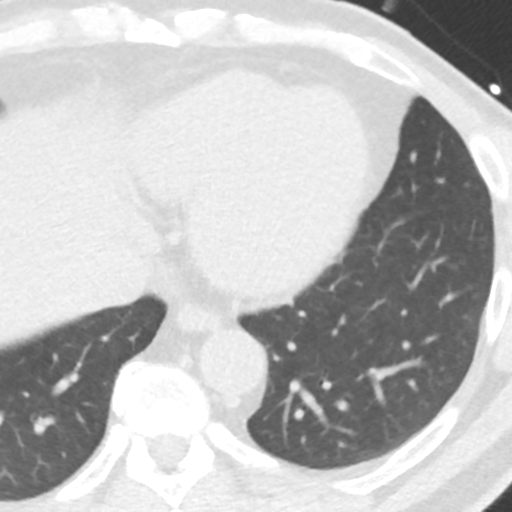
[im 15/38  vessel]
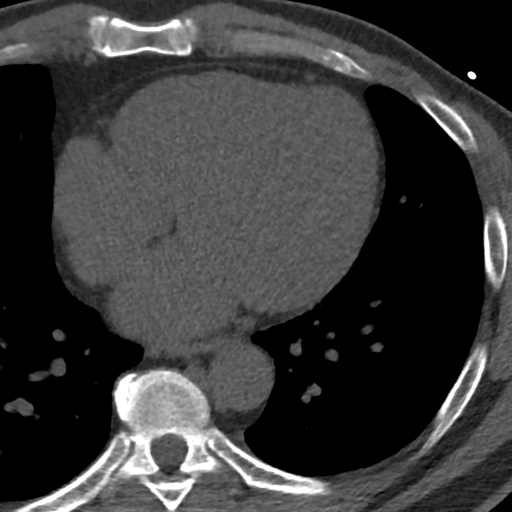
[im 23/38  vessel]
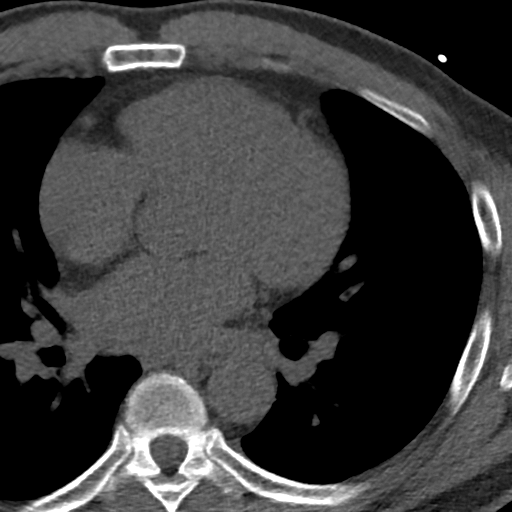
[im 30/38  vessel]
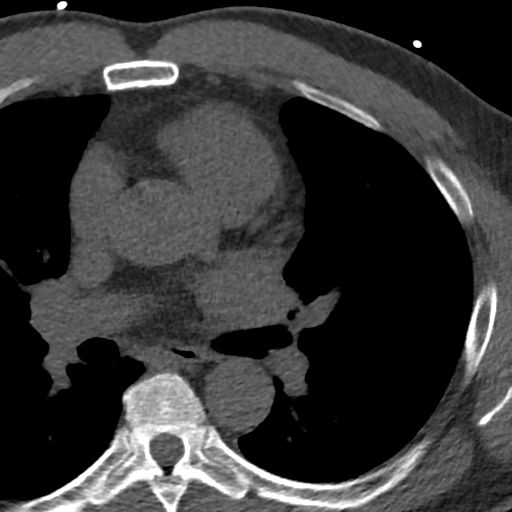

[Series 3: lung 70 % · axial · 0.70mm/px · z∈[-250,-178]mm · 5 of 38 slices shown]
[im 7/38  lung]
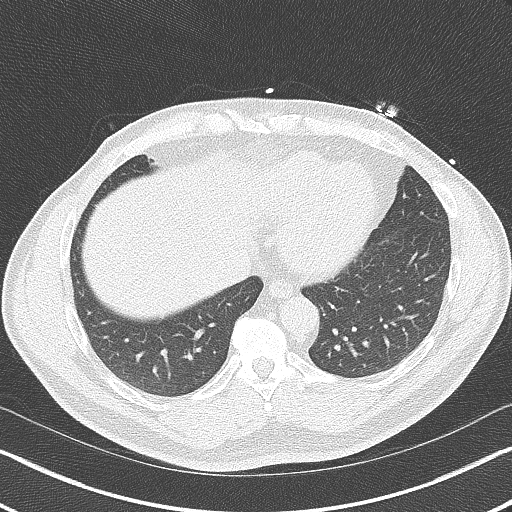
[im 13/38  lung]
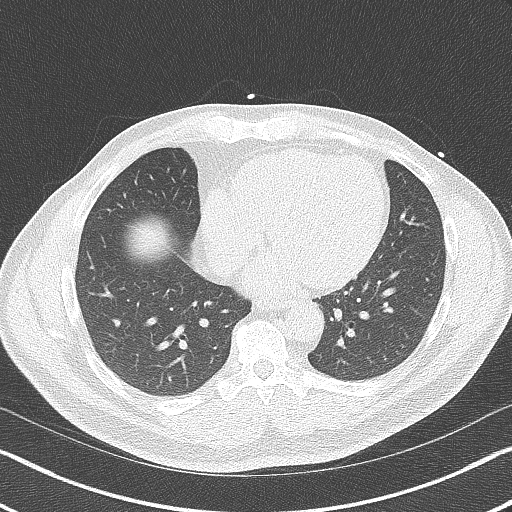
[im 19/38  lung]
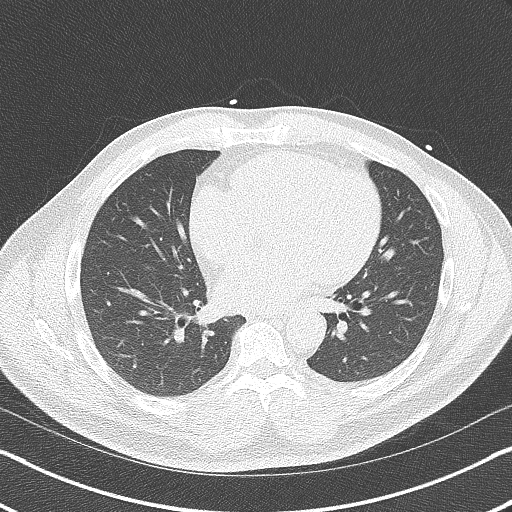
[im 25/38  lung]
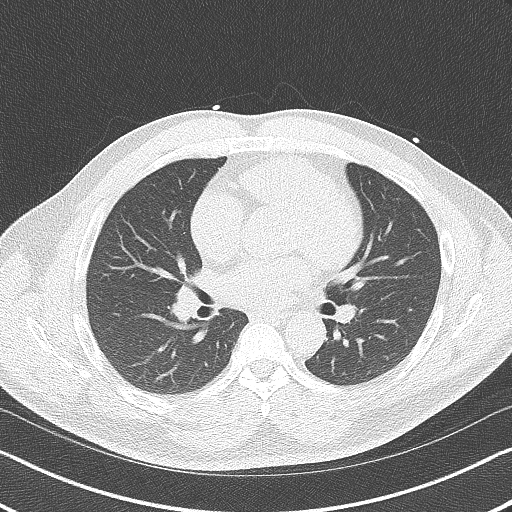
[im 31/38  lung]
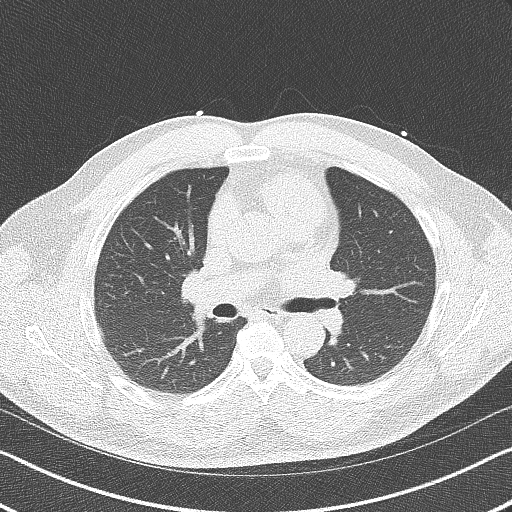

[Series 4: lung st 70 % · axial · 0.70mm/px · z∈[-250,-178]mm · 5 of 38 slices shown]
[im 7/38  lung]
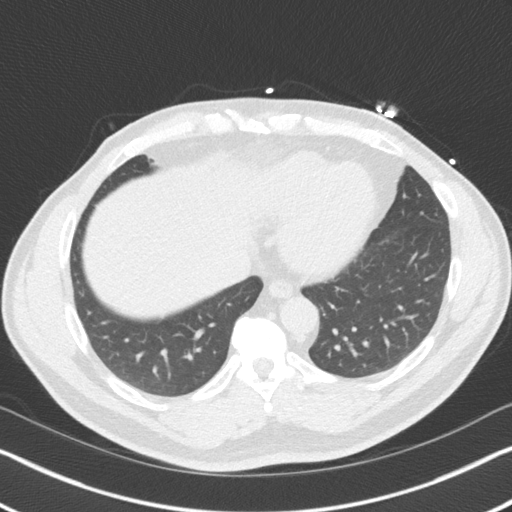
[im 13/38  lung]
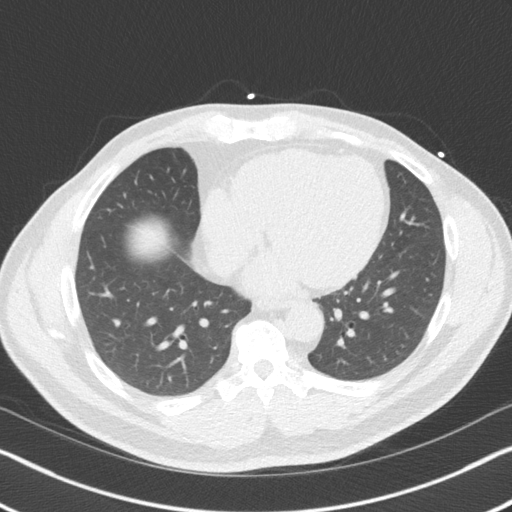
[im 19/38  lung]
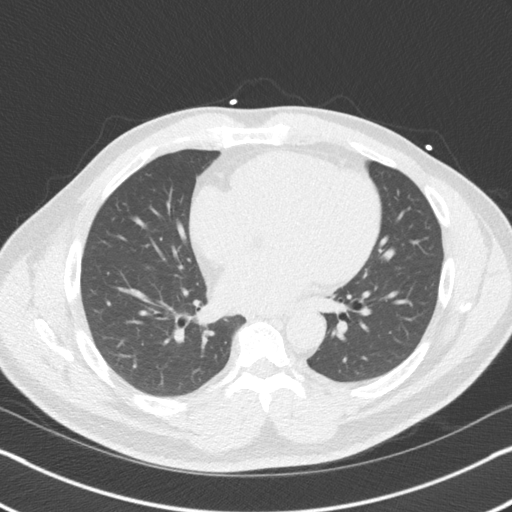
[im 25/38  lung]
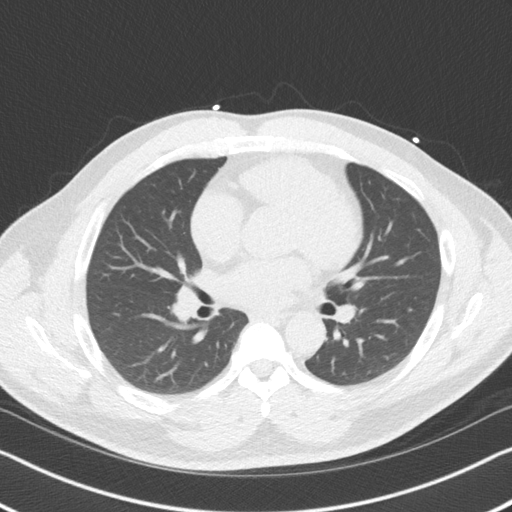
[im 31/38  lung]
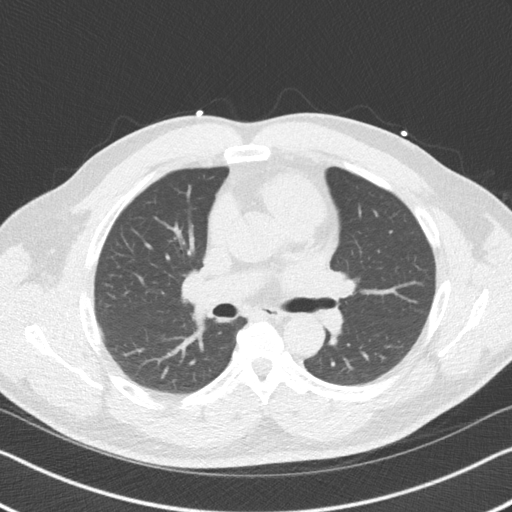

[14 of 20 positions shown; findings below may reference images not displayed]

FINDINGS: Non-cardiac: No significant non cardiac findings on limited lung and
soft tissue windows. See separate report from [REDACTED].

Ascending Aorta: Normal diameter 3.4 cm

Pericardium: Normal

Coronary arteries: No calcium noted
IMPRESSION: Coronary calcium score of 0.

Adiegunawan Rabels

EXAM:
OVER-READ INTERPRETATION  CT CHEST

The following report is an over-read performed by radiologist Dr.
does not include interpretation of cardiac or coronary anatomy or
pathology. The coronary calcium score interpretation by the
cardiologist is attached.
FINDINGS: Cardiovascular: No pericardial effusion.  Heart size normal.

Mediastinum/nodes: No mass or adenopathy identified.

Lungs/pleura: No pleural effusion, airspace consolidation or
atelectasis.

Upper abdomen: No acute abnormality.

Musculoskeletal: No acute or significant osseous abnormality.
IMPRESSION: 1. Negative over-read.

*** End of Addendum ***
FINDINGS: Non-cardiac: No significant non cardiac findings on limited lung and
soft tissue windows. See separate report from [REDACTED].

Ascending Aorta: Normal diameter 3.4 cm

Pericardium: Normal

Coronary arteries: No calcium noted
IMPRESSION: Coronary calcium score of 0.

Adiegunawan Rabels

## 2020-07-25 DIAGNOSIS — N5202 Corporo-venous occlusive erectile dysfunction: Secondary | ICD-10-CM | POA: Diagnosis not present

## 2020-07-25 DIAGNOSIS — E1169 Type 2 diabetes mellitus with other specified complication: Secondary | ICD-10-CM | POA: Diagnosis not present

## 2020-07-25 DIAGNOSIS — I10 Essential (primary) hypertension: Secondary | ICD-10-CM | POA: Diagnosis not present

## 2020-07-25 DIAGNOSIS — Z0000 Encounter for general adult medical examination without abnormal findings: Secondary | ICD-10-CM | POA: Diagnosis not present

## 2020-07-25 DIAGNOSIS — E7800 Pure hypercholesterolemia, unspecified: Secondary | ICD-10-CM | POA: Diagnosis not present

## 2020-07-25 DIAGNOSIS — E039 Hypothyroidism, unspecified: Secondary | ICD-10-CM | POA: Diagnosis not present

## 2020-07-25 DIAGNOSIS — Z125 Encounter for screening for malignant neoplasm of prostate: Secondary | ICD-10-CM | POA: Diagnosis not present

## 2020-09-09 DIAGNOSIS — U071 COVID-19: Secondary | ICD-10-CM | POA: Diagnosis not present

## 2020-11-26 DIAGNOSIS — J209 Acute bronchitis, unspecified: Secondary | ICD-10-CM | POA: Diagnosis not present

## 2020-11-26 DIAGNOSIS — J029 Acute pharyngitis, unspecified: Secondary | ICD-10-CM | POA: Diagnosis not present

## 2020-12-18 DIAGNOSIS — Z0100 Encounter for examination of eyes and vision without abnormal findings: Secondary | ICD-10-CM | POA: Diagnosis not present

## 2021-03-16 NOTE — Progress Notes (Deleted)
Primary Care Provider: Johny Blamer, MD Cardiologist: None Electrophysiologist: None  Clinic Note: No chief complaint on file.   ===================================  ASSESSMENT/PLAN   Problem List Items Addressed This Visit     Hyperlipidemia associated with type 2 diabetes mellitus (HCC) (Chronic)   Family history of premature CAD (Chronic)   Essential hypertension - Primary (Chronic)    ===================================  HPI:    Adrian Hampton is a 60 y.o. male with a PMH notable for recently diagnosed DM-2 (A1c 8.6), HTN and borderline HLD with Significant Family History of CAD below who presents today for 76-month follow-up.  Adrian Hampton was last seen on October 11, 2019 at the request of Johny Blamer, MD. after being diagnosed with diabetes, the patient became concerned with his family history-(grandfather having premature CAD).  He wanted to be evaluated by cardiology.  He admittedly had not been as active as he should be and had also been not eating "like he should ".  Since his visit with Dr. Tiburcio Pea, he had made significant changes to his diet and increase his exercise level.  He reduce his alcohol intake to maybe 1 or 2 drinks per week as opposed to once every night.  He had already lost 10 pounds by the time of my visit.  He denied any active cardiac symptoms concerning for angina or CHF. Screening Coronary Calcium Score ordered.  Recent Hospitalizations: ***  Reviewed  CV studies:    The following studies were reviewed today: (if available, images/films reviewed: From Epic Chart or Care Everywhere) Coronary Calcium Score 10/21/2020: CAC score 0.  very low risk.   With cardiac risk factors of hypertension hyperlipidemia as well as diabetes, would still target LDL < 100.  Interval History:   Adrian Hampton   CV Review of Symptoms (Summary) Cardiovascular ROS: {roscv:310661}  REVIEWED OF SYSTEMS   ROS  I have reviewed and (if needed) personally  updated the patient's problem list, medications, allergies, past medical and surgical history, social and family history.   PAST MEDICAL HISTORY   Past Medical History:  Diagnosis Date   Diabetes mellitus type II, non insulin dependent (HCC)    History of vertigo    Hyperlipidemia associated with type 2 diabetes mellitus (HCC)    Hypertension    Thyroid disease     PAST SURGICAL HISTORY   Past Surgical History:  Procedure Laterality Date   CHOLECYSTECTOMY N/A 11/01/2013   Procedure: LAPAROSCOPIC CHOLECYSTECTOMY;  Surgeon: Cherylynn Ridges, MD;  Location: [facility];  Service: General;  Laterality: N/A;   COLONOSCOPY     THYROID SURGERY  2005   thyroidectomy   UPPER GI ENDOSCOPY      There is no immunization history on file for this patient.  MEDICATIONS/ALLERGIES   No outpatient medications have been marked as taking for the 03/17/21 encounter (Appointment) with Marykay Lex, MD.    No Known Allergies  SOCIAL HISTORY/FAMILY HISTORY   Reviewed in Epic:  Pertinent findings:  Social History   Tobacco Use   Smoking status: Never   Smokeless tobacco: Never  Substance Use Topics   Alcohol use: Yes    Alcohol/week: 4.0 standard drinks    Types: 4 Standard drinks or equivalent per week    Comment: socailly   Drug use: No   Social History   Social History Narrative   Not on file    OBJCTIVE -PE, EKG, labs   Wt Readings from Last 3 Encounters:  10/11/19 178  lb 12.8 oz (81.1 kg)  11/20/13 166 lb 3.2 oz (75.4 kg)  11/13/13 166 lb 12.8 oz (75.7 kg)    Physical Exam: There were no vitals taken for this visit. Physical Exam Vitals reviewed.  Constitutional:      General: He is not in acute distress.    Appearance: Normal appearance. He is normal weight. He is not ill-appearing or toxic-appearing.  HENT:     Head: Normocephalic and atraumatic.  Neck:     Vascular: No carotid bruit or JVD.  Cardiovascular:     Rate and Rhythm: Normal rate and  regular rhythm. No extrasystoles are present.    Chest Wall: PMI is not displaced.     Pulses: Normal pulses.     Heart sounds: Normal heart sounds. No murmur heard.   No friction rub. No gallop.  Pulmonary:     Effort: Pulmonary effort is normal. No respiratory distress.     Breath sounds: Normal breath sounds. No wheezing, rhonchi or rales.  Chest:     Chest wall: No tenderness.  Musculoskeletal:        General: No swelling. Normal range of motion.     Cervical back: Normal range of motion and neck supple.  Skin:    General: Skin is warm and dry.  Neurological:     General: No focal deficit present.     Mental Status: He is alert and oriented to person, place, and time.  Psychiatric:        Mood and Affect: Mood normal.        Behavior: Behavior normal.        Thought Content: Thought content normal.        Judgment: Judgment normal.     Adult ECG Report  Rate: *** ;  Rhythm: {rhythm:17366};   Narrative Interpretation: ***  Recent Labs:   08/01/2020 -> TC 196, TG 125, HDL 37, LDL 34.  A1c 8.6.   ==================================================  COVID-19 Education: The signs and symptoms of COVID-19 were discussed with the patient and how to seek care for testing (follow up with PCP or arrange E-visit).    I spent a total of ***minutes with the patient spent in direct patient consultation.  Additional time spent with chart review  / charting (studies, outside notes, etc): *** min Total Time: *** min  Current medicines are reviewed at length with the patient today.  (+/- concerns) ***  This visit occurred during the SARS-CoV-2 public health emergency.  Safety protocols were in place, including screening questions prior to the visit, additional usage of staff PPE, and extensive cleaning of exam room while observing appropriate contact time as indicated for disinfecting solutions.  Notice: This dictation was prepared with Dragon dictation along with smaller phrase  technology. Any transcriptional errors that result from this process are unintentional and may not be corrected upon review.  Patient Instructions / Medication Changes & Studies & Tests Ordered   There are no Patient Instructions on file for this visit.   Studies Ordered:   No orders of the defined types were placed in this encounter.    Bryan Lemma, M.D., M.S. Interventional Cardiologist   Pager # [phone] Phone # [phone] [address] [place], [place] [address]   Thank you for choosing Heartcare at [facility]!!

## 2021-03-17 ENCOUNTER — Ambulatory Visit: Payer: 59 | Admitting: Cardiology

## 2021-06-11 DIAGNOSIS — N400 Enlarged prostate without lower urinary tract symptoms: Secondary | ICD-10-CM | POA: Diagnosis not present

## 2021-06-23 ENCOUNTER — Ambulatory Visit: Payer: 59 | Admitting: Cardiology

## 2021-07-20 ENCOUNTER — Other Ambulatory Visit: Payer: Self-pay

## 2021-07-20 ENCOUNTER — Ambulatory Visit (INDEPENDENT_AMBULATORY_CARE_PROVIDER_SITE_OTHER): Payer: 59 | Admitting: Cardiology

## 2021-07-20 VITALS — BP 138/78 | HR 63 | Ht 68.0 in | Wt 176.4 lb

## 2021-07-20 DIAGNOSIS — E1169 Type 2 diabetes mellitus with other specified complication: Secondary | ICD-10-CM

## 2021-07-20 DIAGNOSIS — I10 Essential (primary) hypertension: Secondary | ICD-10-CM | POA: Diagnosis not present

## 2021-07-20 DIAGNOSIS — Z8249 Family history of ischemic heart disease and other diseases of the circulatory system: Secondary | ICD-10-CM | POA: Diagnosis not present

## 2021-07-20 DIAGNOSIS — E119 Type 2 diabetes mellitus without complications: Secondary | ICD-10-CM

## 2021-07-20 DIAGNOSIS — E785 Hyperlipidemia, unspecified: Secondary | ICD-10-CM

## 2021-07-20 NOTE — Patient Instructions (Addendum)
Medication Instructions:  NO CHANGES   *If you need a refill on your cardiac medications before your next appointment, please call your pharmacy*   Lab Work:  PLEASE HAVE PRIMARY SEND COPY OF LABS FROM NEXT VISIT    RECOMMENDATION TO RECHECK LABS ( LIPID) 3 MONTHS AFTER  RISK MODIFICATIONS   SUGGEST STARTING ROSUVASTATIN  IF CHOLESTEROL IS STILL ELEVATED    Testing/Procedures:  NOT NEEDED  Follow-Up: At [facility], you and your health needs are our priority.  As part of our continuing mission to provide you with exceptional heart care, we have created designated Provider Care Teams.  These Care Teams include your primary Cardiologist (physician) and Advanced Practice Providers (APPs -  Physician Assistants and Nurse Practitioners) who all work together to provide you with the care you need, when you need it.     Your next appointment:   13 month(s)  The format for your next appointment:   In Person  Provider:   Bryan Lemma, MD    Other Instructions    WILL NEED TO HAVE LABS DONE PRIOR TO NEXT VISIT -- LIPID ,CMP

## 2021-07-20 NOTE — Progress Notes (Signed)
Primary Care Provider: Shirline Frees, MD Cardiologist: Glenetta Hew, MD Electrophysiologist: None  Clinic Note: Chief Complaint  Patient presents with   Follow-up    39-month-follow-up.  Reviewed results of coronary calcium score    ===================================  ASSESSMENT/PLAN   Problem List Items Addressed This Visit       Cardiology Problems   Hyperlipidemia associated with type 2 diabetes mellitus (HCC) (Chronic)    Lipids of the last year were not controlled.  Thankfully, his coronary calcium score is 0.  We have room for but with diagnosis of diabetes, target LDL should be less than 100.  He is due to have labs checked next week.  If not at goal, would allow for 1 more 60-month assessment after.  Continue dietary adjustment and exercise increase.  If not at goal, would then consider starting a statin.  Will defer to PCP given the coronary calcium score of 0.      Relevant Medications   amLODipine (NORVASC) 10 MG tablet   Other Relevant Orders   EKG 12-Lead (Completed)   Essential hypertension (Chronic)    Borderline blood pressures.  Pressures at home feeling better than they are here.  He is on amlodipine.  Neck step is diabetic should be to add ACE inhibitor or ARB for renal protection.  He will monitor his blood pressure. Defer timing of initiation of additional agent to PCP.      Relevant Medications   amLODipine (NORVASC) 10 MG tablet     Other   Type 2 diabetes mellitus without complication, without long-term current use of insulin (HCC) (Chronic)   Relevant Orders   EKG 12-Lead (Completed)   Family history of premature CAD - Primary (Chronic)    Significant risk factors, however his coronary calcium score is 0.  Very reassuring.  However with family history and diabetes hypertension, would still target LDL<100.  With no notable coronary calcium, okay to hold off on starting aspirin.      Relevant Orders   EKG 12-Lead (Completed)     ===================================  HPI:    Adrian Hampton is a 60 y.o. male with a PMH notable for DM-2 (diagnosed in January 2021, HTN, HLD as CARDIAC RISK FACTORS along with Family History of CAD who presents today for almost 2-year follow-up to discuss results of coronary calcium scoring.Prescilla Sours was last seen on January 2021.  He knowledges that he was not as active with the had previously been.  Was trying to get back and exercise.  He had lost 10 pounds after finding out that he was diagnosed with diabetes.  Was working out 4 to 5 days a week and made significant dietary changes.  Had also reduced his alcohol intake to 1-2 drinks a week as opposed to 1 drink at night.  Denies any active cardiac symptoms. Coronary calcium score ordered to establish baseline cardiovascular risk.   Recent Hospitalizations: None  Reviewed  CV studies:    The following studies were reviewed today: (if available, images/films reviewed: From Epic Chart or Care Everywhere) Coronary Calcium Score 10/22/2019: Score is 0.   Interval History:   Adrian Hampton returns for very delayed follow-up doing fairly well.  He never did start the planned medication for diabetes last year.  He was open to try to control things with diet and exercise.  As of November 2021, his A1c was 6.6.  LDL is 148.  He is due to get labs checked next week.  He  does note his home blood pressure range runs from 120/70-80-130/80s.  He denies any rest or exertional dyspnea.  No chest pain or shortness of exertion.  No heart failure symptoms of PND, orthopnea or edema.  No rapid irregular heartbeats or syncope/near syncope.  No TIA or amaurosis fugax symptoms.  No claudication.  He got his weight down into the 160s and then went up to the 190s again.  Weight is back down again to 176.  REVIEWED OF SYSTEMS   Review of Systems  Constitutional:  Negative for malaise/fatigue and weight loss (Weight has been up and down.  Currently  downtrending.).  HENT:  Negative for nosebleeds.   Respiratory:  Negative for cough, shortness of breath and wheezing.   Cardiovascular: Negative.        Per HPI  Gastrointestinal:  Negative for blood in stool, constipation, diarrhea and melena.  Genitourinary:  Negative for hematuria.  Musculoskeletal:  Negative for falls, joint pain and myalgias.  Neurological:  Negative for dizziness, loss of consciousness, weakness and headaches.  Psychiatric/Behavioral: Negative.     I have reviewed and (if needed) personally updated the patient's problem list, medications, allergies, past medical and surgical history, social and family history.   PAST MEDICAL HISTORY   Past Medical History:  Diagnosis Date   Diabetes mellitus type II, non insulin dependent ([place])    History of vertigo    Hyperlipidemia associated with type 2 diabetes mellitus ([place])    Hypertension    Thyroid disease     PAST SURGICAL HISTORY   Past Surgical History:  Procedure Laterality Date   CHOLECYSTECTOMY N/A 11/01/2013   Procedure: LAPAROSCOPIC CHOLECYSTECTOMY;  Surgeon: Gwenyth Ober, MD;  Location: [facility];  Service: General;  Laterality: N/A;   COLONOSCOPY     THYROID SURGERY  2005   thyroidectomy   UPPER GI ENDOSCOPY       There is no immunization history on file for this patient.  MEDICATIONS/ALLERGIES   Current Meds  Medication Sig   amLODipine (NORVASC) 10 MG tablet Take 1 tablet by mouth daily.   glucose blood (ONETOUCH VERIO) test strip USE TO CHECK BLOOD SUGAR ONCE DAILY   levothyroxine (SYNTHROID, LEVOTHROID) 112 MCG tablet Take 112 mcg by mouth daily.   [DISCONTINUED] lisinopril (PRINIVIL,ZESTRIL) 40 MG tablet Take 40 mg by mouth daily.   [DISCONTINUED] promethazine (PHENERGAN) 12.5 MG tablet Take 12.5 mg by mouth every 6 (six) hours as needed for nausea or vomiting.    No Known Allergies  SOCIAL HISTORY/FAMILY HISTORY   Reviewed in Epic:  Pertinent findings:  Social  History   Tobacco Use   Smoking status: Never   Smokeless tobacco: Never  Substance Use Topics   Alcohol use: Yes    Alcohol/week: 4.0 standard drinks    Types: 4 Standard drinks or equivalent per week    Comment: socailly   Drug use: No   Social History   Social History Narrative   Not on file    OBJCTIVE -PE, EKG, labs   Wt Readings from Last 3 Encounters:  07/20/21 176 lb 6.4 oz (80 kg)  10/11/19 178 lb 12.8 oz (81.1 kg)  11/20/13 166 lb 3.2 oz (75.4 kg)  Was up to 190 lb  Physical Exam: BP 138/78   Pulse 63   Ht 5\' 8"  (1.727 m)   Wt 176 lb 6.4 oz (80 kg)   SpO2 98%   BMI 26.82 kg/m  Physical Exam Vitals reviewed.  Constitutional:  General: He is not in acute distress.    Appearance: Normal appearance. He is normal weight. He is not ill-appearing.  HENT:     Head: Normocephalic and atraumatic.  Neck:     Vascular: No carotid bruit or JVD.  Cardiovascular:     Rate and Rhythm: Normal rate and regular rhythm. No extrasystoles are present.    Chest Wall: PMI is not displaced.     Pulses: Normal pulses. No decreased pulses.     Heart sounds: Normal heart sounds and S1 normal.    No friction rub. No gallop.  Musculoskeletal:        General: No swelling. Normal range of motion.     Cervical back: Normal range of motion and neck supple.  Skin:    General: Skin is warm and dry.  Neurological:     General: No focal deficit present.     Mental Status: He is alert and oriented to person, place, and time.  Psychiatric:        Mood and Affect: Mood normal.        Behavior: Behavior normal.        Thought Content: Thought content normal.        Judgment: Judgment normal.    Adult ECG Report  Rate: 63 ;  Rhythm: normal sinus rhythm and normal axis,intervals & durations ;   Narrative Interpretation: stable - normal EKG  Recent Labs: Due to check with PCP next week. 07/25/2020: TC 206, TG 103, HDL 39, LDL 148.  A1c 6.6.  Hgb 16.1. Cr 1.37, K+ 4.6.  TSH  3.07. No results found for: CHOL, HDL, LDLCALC, LDLDIRECT, TRIG, CHOLHDL Lab Results  Component Value Date   CREATININE 1.00 11/20/2013   BUN 11 11/20/2013   NA 142 11/20/2013   K 4.4 11/20/2013   CL 105 11/20/2013   CO2 25 11/20/2013   CBC Latest Ref Rng & Units 11/20/2013 11/01/2013 10/29/2013  WBC 4.0 - 10.5 K/uL 5.8 - 4.9  Hemoglobin 13.0 - 17.0 g/dL 17.6 16.0 73.7  Hematocrit 39.0 - 52.0 % 45.5 - 42.7  Platelets 150 - 400 K/uL 387 - 354    No results found for: HGBA1C No results found for: TSH  ==================================================  COVID-19 Education: The signs and symptoms of COVID-19 were discussed with the patient and how to seek care for testing (follow up with PCP or arrange E-visit).    I spent a total of 23 minutes with the patient spent in direct patient consultation.  Additional time spent with chart review  / charting (studies, outside notes, etc): Follow-up min Total Time: 35 min  Current medicines are reviewed at length with the patient today.  (+/- concerns) none  This visit occurred during the SARS-CoV-2 public health emergency.  Safety protocols were in place, including screening questions prior to the visit, additional usage of staff PPE, and extensive cleaning of exam room while observing appropriate contact time as indicated for disinfecting solutions.  Notice: This dictation was prepared with Dragon dictation along with smart phrase technology. Any transcriptional errors that result from this process are unintentional and may not be corrected upon review.  Patient Instructions / Medication Changes & Studies & Tests Ordered   Patient Instructions  Medication Instructions:  NO CHANGES   *If you need a refill on your cardiac medications before your next appointment, please call your pharmacy*   Lab Work:  PLEASE HAVE PRIMARY SEND COPY OF LABS FROM NEXT VISIT    RECOMMENDATION TO RECHECK  LABS ( LIPID) 3 MONTHS AFTER  RISK MODIFICATIONS    SUGGEST STARTING ROSUVASTATIN  IF CHOLESTEROL IS STILL ELEVATED    Testing/Procedures:  NOT NEEDED  Follow-Up: At [facility], you and your health needs are our priority.  As part of our continuing mission to provide you with exceptional heart care, we have created designated Provider Care Teams.  These Care Teams include your primary Cardiologist (physician) and Advanced Practice Providers (APPs -  Physician Assistants and Nurse Practitioners) who all work together to provide you with the care you need, when you need it.     Your next appointment:   13 month(s)  The format for your next appointment:   In Person  Provider:   Glenetta Hew, MD    Other Instructions    WILL NEED TO HAVE LABS DONE PRIOR TO NEXT VISIT -- LIPID ,CMP    Studies Ordered:   Orders Placed This Encounter  Procedures   EKG 12-Lead      Glenetta Hew, M.D., M.S. Interventional Cardiologist   Pager # [phone] Phone # [phone] [address] [place], [place] [address]   Thank you for choosing Heartcare at [facility]!!

## 2021-08-09 ENCOUNTER — Encounter: Payer: Self-pay | Admitting: Cardiology

## 2021-08-09 NOTE — Assessment & Plan Note (Signed)
Lipids of the last year were not controlled.  Thankfully, his coronary calcium score is 0.  We have room for but with diagnosis of diabetes, target LDL should be less than 100.  He is due to have labs checked next week.  If not at goal, would allow for 1 more 55-month assessment after.  Continue dietary adjustment and exercise increase.  If not at goal, would then consider starting a statin.  Will defer to PCP given the coronary calcium score of 0.

## 2021-08-09 NOTE — Assessment & Plan Note (Signed)
Significant risk factors, however his coronary calcium score is 0.  Very reassuring.  However with family history and diabetes hypertension, would still target LDL<100.  With no notable coronary calcium, okay to hold off on starting aspirin.

## 2021-08-09 NOTE — Assessment & Plan Note (Signed)
Borderline blood pressures.  Pressures at home feeling better than they are here.  He is on amlodipine.  Neck step is diabetic should be to add ACE inhibitor or ARB for renal protection.  He will monitor his blood pressure. Defer timing of initiation of additional agent to PCP.

## 2021-08-31 DIAGNOSIS — E1169 Type 2 diabetes mellitus with other specified complication: Secondary | ICD-10-CM | POA: Diagnosis not present

## 2021-08-31 DIAGNOSIS — Z1211 Encounter for screening for malignant neoplasm of colon: Secondary | ICD-10-CM | POA: Diagnosis not present

## 2021-08-31 DIAGNOSIS — E7800 Pure hypercholesterolemia, unspecified: Secondary | ICD-10-CM | POA: Diagnosis not present

## 2021-08-31 DIAGNOSIS — Z0000 Encounter for general adult medical examination without abnormal findings: Secondary | ICD-10-CM | POA: Diagnosis not present

## 2021-08-31 DIAGNOSIS — N5202 Corporo-venous occlusive erectile dysfunction: Secondary | ICD-10-CM | POA: Diagnosis not present

## 2021-08-31 DIAGNOSIS — E039 Hypothyroidism, unspecified: Secondary | ICD-10-CM | POA: Diagnosis not present

## 2021-08-31 DIAGNOSIS — I10 Essential (primary) hypertension: Secondary | ICD-10-CM | POA: Diagnosis not present

## 2022-01-29 DIAGNOSIS — Z13220 Encounter for screening for lipoid disorders: Secondary | ICD-10-CM | POA: Diagnosis not present

## 2022-01-29 DIAGNOSIS — Z131 Encounter for screening for diabetes mellitus: Secondary | ICD-10-CM | POA: Diagnosis not present

## 2022-01-29 DIAGNOSIS — Z713 Dietary counseling and surveillance: Secondary | ICD-10-CM | POA: Diagnosis not present

## 2022-04-15 DIAGNOSIS — E119 Type 2 diabetes mellitus without complications: Secondary | ICD-10-CM | POA: Diagnosis not present

## 2022-10-04 DIAGNOSIS — Z125 Encounter for screening for malignant neoplasm of prostate: Secondary | ICD-10-CM | POA: Diagnosis not present

## 2022-10-04 DIAGNOSIS — N5202 Corporo-venous occlusive erectile dysfunction: Secondary | ICD-10-CM | POA: Diagnosis not present

## 2022-10-04 DIAGNOSIS — I10 Essential (primary) hypertension: Secondary | ICD-10-CM | POA: Diagnosis not present

## 2022-10-04 DIAGNOSIS — E039 Hypothyroidism, unspecified: Secondary | ICD-10-CM | POA: Diagnosis not present

## 2022-10-04 DIAGNOSIS — E7800 Pure hypercholesterolemia, unspecified: Secondary | ICD-10-CM | POA: Diagnosis not present

## 2022-10-04 DIAGNOSIS — Z0000 Encounter for general adult medical examination without abnormal findings: Secondary | ICD-10-CM | POA: Diagnosis not present

## 2022-10-04 DIAGNOSIS — E1169 Type 2 diabetes mellitus with other specified complication: Secondary | ICD-10-CM | POA: Diagnosis not present

## 2023-01-27 DIAGNOSIS — N400 Enlarged prostate without lower urinary tract symptoms: Secondary | ICD-10-CM | POA: Diagnosis not present

## 2023-04-04 NOTE — Progress Notes (Deleted)
Cardiology Office Note:  .   Date:  04/05/2023  ID:  Adrian Hampton, DOB Feb 28, 1961, MRN [id number] PCP: Noberto Retort, MD  [facility] Providers Cardiologist:  Bryan Lemma, MD { Click to update primary MD,subspecialty MD or APP then REFRESH:1}    No chief complaint on file.   History of Present Illness: .     Adrian Hampton is a relatively healthy 62 y.o. male with a PMH notable for HTN, HLD & DM-2 with a Family History of CAD, but CAC Score of 0 who presents here for ~21 follow-up at the request of Noberto Retort, MD.  Problem List Items Addressed This Visit       Cardiology Problems   Hyperlipidemia associated with type 2 diabetes mellitus (HCC) (Chronic)   Essential hypertension - Primary (Chronic)     Other   Family history of premature CAD (Chronic)     Adrian Hampton was last seen on July 20, 2021 for delayed follow-up.  He is doing well.  He never did start the medication as planned for treating diabetes.  As of November 2021 his A1c was 6.6 and LDL is 148 and was due to get labs checked.  BP range was 120-130s/80s.  Denies any cardiac symptoms.  Had initially done well with weight loss dropping from 190s to the 160s but was able to go back down to the 176 range. => Referred to PCP for management of lipids based on Coronary Calcium Score 0.    Subjective   INTERVAL HISTORY Seen by PCP October 04, 2022: BP was 125/78 mmHg- plan continue amlodipine.  Recommended endocrinology follow-up.  Recommended discussing statin medication with cardiologist? Meds: Amlodipine 10 mg daily, Synthroid 112 mcg every morning, sildenafil 100 mg.  Adrian Hampton   ROS:  Cardiovascular ROS: {roscv:310661} Review of Systems - {ros master:310782}      Objective   Studies Reviewed: Marland Kitchen       Coronary Calcium Score (10/22/19): CAC Score 0.  Lipid panel 01/29/2022: TC 124, TG 81, HDL 34, LDL 124.;  A1c 6.18 Sep 2022:  Na+ 137, K+ 4.0, Cl- 98, HCO3-  32 , BUN 11, Cr 1.35, Glu  124, Ca2+ 10.0; AST 28, ALT 44, AlkP 90 CBC: W 5.3, H/H 15.9/47.5, Plt 316 TC 182, TG 108, HDL 47, LDL 116; TSH 2.7; Y8M 6.5   Risk Assessment/Calculations:     No BP recorded.  {Refresh Note OR Click here to enter BP  :1}***        Physical Exam:   VS:  There were no vitals taken for this visit.   Wt Readings from Last 3 Encounters:  07/20/21 176 lb 6.4 oz (80 kg)  10/11/19 178 lb 12.8 oz (81.1 kg)  11/20/13 166 lb 3.2 oz (75.4 kg)    GEN: Well nourished, well developed in no acute distress; healthy-appearing NECK: No JVD; No carotid bruits CARDIAC: Normal S1, S2; RRR, no murmurs, rubs, gallops RESPIRATORY:  Clear to auscultation without rales, wheezing or rhonchi ; nonlabored, good air movement. ABDOMEN: Soft, non-tender, non-distended EXTREMITIES:  No edema; No deformity     ASSESSMENT AND PLAN: .    Problem List Items Addressed This Visit       Cardiology Problems   Hyperlipidemia associated with type 2 diabetes mellitus (HCC) (Chronic)   Essential hypertension - Primary (Chronic)     Other   Family history of premature CAD (Chronic)        {Are you ordering  a CV Procedure (e.g. stress test, cath, DCCV, TEE, etc)?   Press F2        :[id number]}   Dispo: No follow-ups on file.  Total time spent: *** min spent with patient + *** min spent charting = *** min    Signed, Marykay Lex, MD, MS Bryan Lemma, M.D., M.S. Interventional Cardiologist  [facility]  Pager # [phone] Phone # [phone] [address] [place], [place] [address]

## 2023-04-05 ENCOUNTER — Encounter: Payer: Self-pay | Admitting: Cardiology

## 2023-04-05 ENCOUNTER — Ambulatory Visit: Payer: 59 | Attending: Cardiology | Admitting: Cardiology

## 2023-04-05 DIAGNOSIS — Z8249 Family history of ischemic heart disease and other diseases of the circulatory system: Secondary | ICD-10-CM

## 2023-04-05 DIAGNOSIS — I10 Essential (primary) hypertension: Secondary | ICD-10-CM

## 2023-04-05 DIAGNOSIS — E1169 Type 2 diabetes mellitus with other specified complication: Secondary | ICD-10-CM

## 2023-06-28 ENCOUNTER — Ambulatory Visit: Payer: 59 | Attending: Cardiology | Admitting: Cardiology

## 2023-06-28 ENCOUNTER — Encounter: Payer: Self-pay | Admitting: Cardiology

## 2023-06-28 VITALS — BP 122/84 | HR 85 | Ht 68.5 in | Wt 176.0 lb

## 2023-06-28 DIAGNOSIS — E1169 Type 2 diabetes mellitus with other specified complication: Secondary | ICD-10-CM

## 2023-06-28 DIAGNOSIS — Z8249 Family history of ischemic heart disease and other diseases of the circulatory system: Secondary | ICD-10-CM

## 2023-06-28 DIAGNOSIS — R7303 Prediabetes: Secondary | ICD-10-CM | POA: Diagnosis not present

## 2023-06-28 DIAGNOSIS — I10 Essential (primary) hypertension: Secondary | ICD-10-CM

## 2023-06-28 DIAGNOSIS — E785 Hyperlipidemia, unspecified: Secondary | ICD-10-CM | POA: Diagnosis not present

## 2023-06-28 NOTE — Patient Instructions (Addendum)
Medication Instructions:  none *If you need a refill on your cardiac medications before your next appointment, please call your pharmacy*   Lab Work: None  If you have labs (blood work) drawn today and your tests are completely normal, you will receive your results only by: MyChart Message (if you have MyChart) OR A paper copy in the mail If you have any lab test that is abnormal or we need to change your treatment, we will call you to review the results.   Testing/Procedures:  CT coronary calcium score.   Test locations:  [facility]  [facility]  This is $99 out of pocket.   Coronary CalciumScan A coronary calcium scan is an imaging test used to look for deposits of calcium and other fatty materials (plaques) in the inner lining of the blood vessels of the heart (coronary arteries). These deposits of calcium and plaques can partly clog and narrow the coronary arteries without producing any symptoms or warning signs. This puts a person at risk for a heart attack. This test can detect these deposits before symptoms develop. Tell a health care provider about: Any allergies you have. All medicines you are taking, including vitamins, herbs, eye drops, creams, and over-the-counter medicines. Any problems you or family members have had with anesthetic medicines. Any blood disorders you have. Any surgeries you have had. Any medical conditions you have. Whether you are pregnant or may be pregnant. What are the risks? Generally, this is a safe procedure. However, problems may occur, including: Harm to a pregnant woman and her unborn baby. This test involves the use of radiation. Radiation exposure can be dangerous to a pregnant woman and her unborn baby. If you are pregnant, you generally should not have this procedure done. Slight increase in the risk of cancer. This is because of the radiation  involved in the test. What happens before the procedure? No preparation is needed for this procedure. What happens during the procedure? You will undress and remove any jewelry around your neck or chest. You will put on a hospital gown. Sticky electrodes will be placed on your chest. The electrodes will be connected to an electrocardiogram (ECG) machine to record a tracing of the electrical activity of your heart. A CT scanner will take pictures of your heart. During this time, you will be asked to lie still and hold your breath for 2-3 seconds while a picture of your heart is being taken. The procedure may vary among health care providers and hospitals. What happens after the procedure? You can get dressed. You can return to your normal activities. It is up to you to get the results of your test. Ask your health care provider, or the department that is doing the test, when your results will be ready. Summary A coronary calcium scan is an imaging test used to look for deposits of calcium and other fatty materials (plaques) in the inner lining of the blood vessels of the heart (coronary arteries). Generally, this is a safe procedure. Tell your health care provider if you are pregnant or may be pregnant. No preparation is needed for this procedure. A CT scanner will take pictures of your heart. You can return to your normal activities after the scan is done. This information is not intended to replace advice given to you by your health care provider. Make sure you discuss any questions you have with your health care provider. Document Released:  02/26/2008 Document Revised: 07/19/2016 Document Reviewed: 07/19/2016 Elsevier Interactive Patient Education  2017 [employer].  Follow-Up: At [facility], you and your health needs are our priority.  As part of our continuing mission to provide you with exceptional heart care, we have created designated Provider Care Teams.  These Care Teams  include your primary Cardiologist (physician) and Advanced Practice Providers (APPs -  Physician Assistants and Nurse Practitioners) who all work together to provide you with the care you need, when you need it.    Your next appointment:   2 year(s) (if test is normal)  Provider:   Bryan Lemma, MD

## 2023-06-28 NOTE — Progress Notes (Signed)
Cardiology Office Note:  .   Date:  07/03/2023  ID:  Adrian Hampton, DOB [birth year], MRN [id number] PCP: Noberto Retort, MD  [facility] Providers Cardiologist:  Bryan Lemma, MD     Chief Complaint  Patient presents with   Follow-up    Discussed cardiovascular risk factors.  No active symptoms.  Family history of premature CAD.    Patient Profile: .     Adrian Hampton is a 62 y.o. male with a PMH notable for HTN, HLD and DM-2, Family History Premature CAD but Coronary Calcium Score 0 who presents here for 2-year follow-up at the request of Noberto Retort, MD.    Adrian Hampton was last seen on July 20, 2021 to follow-up Coronary Calcium Score found to be 0.  We discussed lipid management in the setting of his diabetes as well as BP control but very reassuring Coronary Calcium Score results.  Subjective  Discussed the use of AI scribe software for clinical note transcription with the patient, who gave verbal consent to proceed.  History of Present Illness   Adrian Hampton, a 62 year old patient with a history of hypertension, presented for a routine follow-up visit. The patient reported no new health issues since the last visit two years ago. He denied experiencing any chest pain, pressure, or tightness. The patient's weight remained stable at 176 pounds over the past two years.  The patient's coronary calcium score was zero at the last check, indicating no significant plaque buildup. However, the patient's LDL cholesterol level was slightly elevated at 116; ideally, it should be less than 100. The patient is not currently on any cholesterol medication.  The patient's blood pressure is well-controlled on medication, and he reported no symptoms of heart racing, palpitations, shortness of breath, or swelling in the legs. The patient also denied any symptoms suggestive of transient ischemic attacks such as sudden onset weakness, numbness, or visual disturbances.  The patient  reported some back discomfort when standing for prolonged periods, but no other musculoskeletal complaints. The patient is physically active, participating in sports and regular workouts.  The patient's blood glucose levels have been slightly elevated, with an HbA1c of 6.5, right at the cutoff for diabetes diagnosis. The patient has been monitoring his blood glucose levels at home, which have been mostly within the normal range, with slightly higher readings in the mornings.  In summary, Adrian Hampton is a 62 year old patient with well-controlled hypertension and slightly elevated cholesterol and blood glucose levels. The patient is physically active and reports no significant cardiovascular symptoms.      ROS:  Review of Systems - Negative except symptoms noted above     Objective   Studies Reviewed: Marland Kitchen   EKG Interpretation Date/Time:  Tuesday June 28 2023 13:25:56 EDT Ventricular Rate:  74 PR Interval:  160 QRS Duration:  106 QT Interval:  422 QTC Calculation: 468 R Axis:   100  Text Interpretation: Normal sinus rhythm Rightward axis When compared with ECG of 29-Oct-2013 10:26, No significant change was found Confirmed by Bryan Lemma ([id number]) on 07/03/2023 10:01:16 PM    Results LABS LDL: 116 mg/dL (19/1478) Total Cholesterol: 182 mg/dL (29/5621) HDL: 47 mg/dL (30/8657) Triglycerides: 108 mg/dL (84/6962) X5M: 8.4% Continuous Glucose Monitor: 94-113 mg/dL  Coronary Calcium Score 2021: CAC-0  Risk Assessment/Calculations:          Physical Exam:   VS:  BP 122/84 (BP Location: Left Arm, Patient Position: Sitting, Cuff Size: Normal)   Pulse 85  Ht 5' 8.5" (1.74 m)   Wt 176 lb (79.8 kg)   SpO2 99%   BMI 26.37 kg/m    Wt Readings from Last 3 Encounters:  06/28/23 176 lb (79.8 kg)  07/20/21 176 lb 6.4 oz (80 kg)  10/11/19 178 lb 12.8 oz (81.1 kg)    GEN: Well nourished, well well-groomed in no acute distress;   healthy-appearing NECK: No JVD; No carotid  bruits CARDIAC: Normal S1, S2; RRR, no murmurs, rubs, gallops RESPIRATORY:  Clear to auscultation without rales, wheezing or rhonchi ; nonlabored, good air movement. ABDOMEN: Soft, non-tender, non-distended EXTREMITIES:  No edema; No deformity      ASSESSMENT AND PLAN: .    Problem List Items Addressed This Visit       Cardiology Problems   Essential hypertension (Chronic)    Hypertension is Well-controlled on current medication. -Continue amlodipine 10 mg daily      Relevant Orders   EKG 12-Lead (Completed)   Hyperlipidemia associated with type 2 diabetes mellitus (HCC) (Chronic)   Relevant Orders   CT CARDIAC SCORING (SELF PAY ONLY)     Other   Family history of premature CAD - Primary (Chronic)    Cardiovascular Risk No symptoms of chest pain, pressure, or tightness. Coronary calcium score was zero in 2022. LDL cholesterol is slightly elevated at 116. Patient is active and maintains a stable weight. -Plan to repeat coronary calcium score in 1 year (January 2025) to monitor for plaque development. -Continue to monitor LDL cholesterol levels. Consider cholesterol-lowering medication if coronary calcium score increases significantly.      Relevant Orders   CT CARDIAC SCORING (SELF PAY ONLY)   Prediabetes (Chronic)    A1c at 6.5, borderline for treatment. Patient checks blood glucose levels regularly with readings mostly within normal range. -Continue to monitor blood glucose levels regularly. -Encourage continued healthy lifestyle habits to prevent progression to diabetes.             Follow-up: Return in about 1 year (around 06/27/2024) for Routine follow up with me, 1 Yr Follow-up. -Return in 2 years if coronary calcium score and cholesterol levels are satisfactory. -Return in 1 year if there are concerning changes in coronary calcium score or cholesterol levels.   Total time spent: 22 min spent with patient + 13 min spent charting = 35 min     Signed, Marykay Lex, MD, MS Bryan Lemma, M.D., M.S. Interventional Cardiologist  [facility]  Pager # [phone] Phone # [phone] [address] [place], [place] [address]

## 2023-07-03 ENCOUNTER — Encounter: Payer: Self-pay | Admitting: Cardiology

## 2023-07-03 DIAGNOSIS — R7303 Prediabetes: Secondary | ICD-10-CM | POA: Insufficient documentation

## 2023-07-03 NOTE — Assessment & Plan Note (Signed)
Hypertension is Well-controlled on current medication. -Continue amlodipine 10 mg daily

## 2023-07-03 NOTE — Assessment & Plan Note (Signed)
Cardiovascular Risk No symptoms of chest pain, pressure, or tightness. Coronary calcium score was zero in 2022. LDL cholesterol is slightly elevated at 116. Patient is active and maintains a stable weight. -Plan to repeat coronary calcium score in 1 year (January 2025) to monitor for plaque development. -Continue to monitor LDL cholesterol levels. Consider cholesterol-lowering medication if coronary calcium score increases significantly.

## 2023-07-03 NOTE — Assessment & Plan Note (Addendum)
Most recent lipids were in January 2024 with an LDL of 116.  With coronary Score of 0, this is acceptable, but would probably need to be more aggressive treating if there is signs coronary artery disease on follow-up Coronary calcium score.  With prediabetes and hypertension, would at least shoot for an LDL less than 100 if not less than 70.  May need to address LDL based on upcoming Coronary Calcium Score.  "

## 2023-07-03 NOTE — Assessment & Plan Note (Signed)
A1c at 6.5, borderline for treatment. Patient checks blood glucose levels regularly with readings mostly within normal range. -Continue to monitor blood glucose levels regularly. -Encourage continued healthy lifestyle habits to prevent progression to diabetes.

## 2023-09-23 DIAGNOSIS — H93293 Other abnormal auditory perceptions, bilateral: Secondary | ICD-10-CM | POA: Diagnosis not present

## 2023-09-23 DIAGNOSIS — J309 Allergic rhinitis, unspecified: Secondary | ICD-10-CM | POA: Diagnosis not present

## 2023-09-23 DIAGNOSIS — I10 Essential (primary) hypertension: Secondary | ICD-10-CM | POA: Diagnosis not present

## 2023-09-23 DIAGNOSIS — R0982 Postnasal drip: Secondary | ICD-10-CM | POA: Diagnosis not present

## 2023-09-23 DIAGNOSIS — R053 Chronic cough: Secondary | ICD-10-CM | POA: Diagnosis not present

## 2023-09-30 ENCOUNTER — Ambulatory Visit (HOSPITAL_BASED_OUTPATIENT_CLINIC_OR_DEPARTMENT_OTHER)
Admission: RE | Admit: 2023-09-30 | Discharge: 2023-09-30 | Disposition: A | Discharge status: Home / Self Care | Payer: Self-pay | Source: Ambulatory Visit | Attending: Cardiology | Admitting: Cardiology

## 2023-09-30 DIAGNOSIS — E1169 Type 2 diabetes mellitus with other specified complication: Secondary | ICD-10-CM | POA: Insufficient documentation

## 2023-09-30 DIAGNOSIS — Z8249 Family history of ischemic heart disease and other diseases of the circulatory system: Secondary | ICD-10-CM | POA: Insufficient documentation

## 2023-09-30 DIAGNOSIS — E785 Hyperlipidemia, unspecified: Secondary | ICD-10-CM | POA: Insufficient documentation

## 2023-10-03 ENCOUNTER — Encounter: Payer: Self-pay | Admitting: Cardiology

## 2023-10-11 DIAGNOSIS — I10 Essential (primary) hypertension: Secondary | ICD-10-CM | POA: Diagnosis not present

## 2023-10-11 DIAGNOSIS — E039 Hypothyroidism, unspecified: Secondary | ICD-10-CM | POA: Diagnosis not present

## 2023-10-11 DIAGNOSIS — E7800 Pure hypercholesterolemia, unspecified: Secondary | ICD-10-CM | POA: Diagnosis not present

## 2023-10-11 DIAGNOSIS — E1169 Type 2 diabetes mellitus with other specified complication: Secondary | ICD-10-CM | POA: Diagnosis not present

## 2023-10-11 DIAGNOSIS — J209 Acute bronchitis, unspecified: Secondary | ICD-10-CM | POA: Diagnosis not present

## 2023-10-11 DIAGNOSIS — N5202 Corporo-venous occlusive erectile dysfunction: Secondary | ICD-10-CM | POA: Diagnosis not present

## 2023-10-11 DIAGNOSIS — Z0000 Encounter for general adult medical examination without abnormal findings: Secondary | ICD-10-CM | POA: Diagnosis not present

## 2023-10-23 DIAGNOSIS — R053 Chronic cough: Secondary | ICD-10-CM | POA: Diagnosis not present

## 2023-12-13 DIAGNOSIS — E039 Hypothyroidism, unspecified: Secondary | ICD-10-CM | POA: Diagnosis not present

## 2023-12-13 DIAGNOSIS — E785 Hyperlipidemia, unspecified: Secondary | ICD-10-CM | POA: Diagnosis not present

## 2023-12-13 DIAGNOSIS — I129 Hypertensive chronic kidney disease with stage 1 through stage 4 chronic kidney disease, or unspecified chronic kidney disease: Secondary | ICD-10-CM | POA: Diagnosis not present

## 2023-12-13 DIAGNOSIS — N400 Enlarged prostate without lower urinary tract symptoms: Secondary | ICD-10-CM | POA: Diagnosis not present

## 2023-12-13 DIAGNOSIS — N1831 Chronic kidney disease, stage 3a: Secondary | ICD-10-CM | POA: Diagnosis not present

## 2023-12-13 DIAGNOSIS — E1122 Type 2 diabetes mellitus with diabetic chronic kidney disease: Secondary | ICD-10-CM | POA: Diagnosis not present

## 2024-01-11 DIAGNOSIS — K115 Sialolithiasis: Secondary | ICD-10-CM | POA: Diagnosis not present

## 2024-01-11 DIAGNOSIS — K1120 Sialoadenitis, unspecified: Secondary | ICD-10-CM | POA: Diagnosis not present
